# Patient Record
Sex: Female | Born: 1990 | Hispanic: Yes | Marital: Married | Smoking: Never smoker
Health system: Southern US, Community
[De-identification: ages and names within clinical notes are randomized; demographics above are authoritative.]

## PROBLEM LIST (undated history)

## (undated) ENCOUNTER — Inpatient Hospital Stay (HOSPITAL_COMMUNITY): Payer: Self-pay

## (undated) ENCOUNTER — Inpatient Hospital Stay (HOSPITAL_COMMUNITY): Payer: Medicaid Other

## (undated) DIAGNOSIS — K219 Gastro-esophageal reflux disease without esophagitis: Secondary | ICD-10-CM

## (undated) DIAGNOSIS — Z789 Other specified health status: Secondary | ICD-10-CM

## (undated) DIAGNOSIS — M549 Dorsalgia, unspecified: Secondary | ICD-10-CM

## (undated) DIAGNOSIS — O24419 Gestational diabetes mellitus in pregnancy, unspecified control: Secondary | ICD-10-CM

## (undated) HISTORY — DX: Gestational diabetes mellitus in pregnancy, unspecified control: O24.419

## (undated) HISTORY — DX: Dorsalgia, unspecified: M54.9

## (undated) HISTORY — DX: Gastro-esophageal reflux disease without esophagitis: K21.9

## (undated) HISTORY — PX: NO PAST SURGERIES: SHX2092

---

## 2010-08-25 ENCOUNTER — Ambulatory Visit (HOSPITAL_COMMUNITY)
Admission: RE | Admit: 2010-08-25 | Discharge: 2010-08-25 | Payer: Self-pay | Source: Home / Self Care | Admitting: Family Medicine

## 2011-01-16 ENCOUNTER — Other Ambulatory Visit: Payer: Self-pay | Admitting: Family Medicine

## 2011-01-16 DIAGNOSIS — O480 Post-term pregnancy: Secondary | ICD-10-CM

## 2011-01-18 ENCOUNTER — Other Ambulatory Visit: Payer: Self-pay

## 2011-01-20 ENCOUNTER — Ambulatory Visit (HOSPITAL_COMMUNITY): Payer: Self-pay

## 2011-01-20 ENCOUNTER — Ambulatory Visit (HOSPITAL_COMMUNITY)
Admission: RE | Admit: 2011-01-20 | Discharge: 2011-01-20 | Disposition: A | Discharge status: Home / Self Care | Payer: Medicaid Other | Source: Ambulatory Visit | Attending: Family Medicine | Admitting: Family Medicine

## 2011-01-20 DIAGNOSIS — O480 Post-term pregnancy: Secondary | ICD-10-CM | POA: Insufficient documentation

## 2011-01-20 DIAGNOSIS — Z3689 Encounter for other specified antenatal screening: Secondary | ICD-10-CM | POA: Insufficient documentation

## 2011-01-23 ENCOUNTER — Inpatient Hospital Stay (HOSPITAL_COMMUNITY): Payer: Medicaid Other

## 2011-01-23 ENCOUNTER — Inpatient Hospital Stay (HOSPITAL_COMMUNITY)
Admission: RE | Admit: 2011-01-23 | Discharge: 2011-01-23 | Disposition: A | Discharge status: Home / Self Care | Payer: Medicaid Other | Source: Ambulatory Visit | Attending: Family Medicine | Admitting: Family Medicine

## 2011-01-23 DIAGNOSIS — O368390 Maternal care for abnormalities of the fetal heart rate or rhythm, unspecified trimester, not applicable or unspecified: Secondary | ICD-10-CM

## 2011-01-23 LAB — URINALYSIS, ROUTINE W REFLEX MICROSCOPIC
Bilirubin Urine: NEGATIVE
Glucose, UA: NEGATIVE mg/dL
Ketones, ur: NEGATIVE mg/dL
Protein, ur: NEGATIVE mg/dL
pH: 6 (ref 5.0–8.0)

## 2011-01-23 LAB — URINE MICROSCOPIC-ADD ON

## 2011-01-26 ENCOUNTER — Inpatient Hospital Stay (HOSPITAL_COMMUNITY)
Admission: AD | Admit: 2011-01-26 | Discharge: 2011-01-29 | DRG: 774 | Disposition: A | Discharge status: Home / Self Care | Payer: Medicaid Other | Source: Ambulatory Visit | Attending: Obstetrics & Gynecology | Admitting: Obstetrics & Gynecology

## 2011-01-26 DIAGNOSIS — O411090 Infection of amniotic sac and membranes, unspecified, unspecified trimester, not applicable or unspecified: Secondary | ICD-10-CM | POA: Diagnosis present

## 2011-01-26 DIAGNOSIS — O480 Post-term pregnancy: Principal | ICD-10-CM | POA: Diagnosis present

## 2011-01-26 LAB — CBC
HCT: 31 % — ABNORMAL LOW (ref 36.0–46.0)
Hemoglobin: 9.8 g/dL — ABNORMAL LOW (ref 12.0–15.0)
MCV: 82 fL (ref 78.0–100.0)
RDW: 16.8 % — ABNORMAL HIGH (ref 11.5–15.5)
WBC: 9.9 10*3/uL (ref 4.0–10.5)

## 2011-01-27 DIAGNOSIS — O480 Post-term pregnancy: Secondary | ICD-10-CM

## 2011-01-27 DIAGNOSIS — O411090 Infection of amniotic sac and membranes, unspecified, unspecified trimester, not applicable or unspecified: Secondary | ICD-10-CM

## 2011-01-28 LAB — ABO/RH: ABO/RH(D): O POS

## 2015-01-11 ENCOUNTER — Ambulatory Visit: Payer: Medicaid Other | Attending: Internal Medicine | Admitting: Internal Medicine

## 2015-01-11 ENCOUNTER — Encounter: Payer: Self-pay | Admitting: Internal Medicine

## 2015-01-11 DIAGNOSIS — R51 Headache: Secondary | ICD-10-CM

## 2015-01-11 DIAGNOSIS — R519 Headache, unspecified: Secondary | ICD-10-CM

## 2015-01-11 DIAGNOSIS — N3000 Acute cystitis without hematuria: Secondary | ICD-10-CM

## 2015-01-11 DIAGNOSIS — R197 Diarrhea, unspecified: Secondary | ICD-10-CM | POA: Insufficient documentation

## 2015-01-11 DIAGNOSIS — R112 Nausea with vomiting, unspecified: Secondary | ICD-10-CM | POA: Insufficient documentation

## 2015-01-11 DIAGNOSIS — R1084 Generalized abdominal pain: Secondary | ICD-10-CM | POA: Insufficient documentation

## 2015-01-11 LAB — POCT URINALYSIS DIPSTICK
BILIRUBIN UA: NEGATIVE
Blood, UA: NEGATIVE
GLUCOSE UA: NEGATIVE
Ketones, UA: NEGATIVE
NITRITE UA: NEGATIVE
PH UA: 6.5
Protein, UA: NEGATIVE
Spec Grav, UA: 1.02
Urobilinogen, UA: 0.2

## 2015-01-11 LAB — POCT URINE PREGNANCY: PREG TEST UR: NEGATIVE

## 2015-01-11 MED ORDER — CIPROFLOXACIN HCL 500 MG PO TABS: 500.0000 mg | ORAL_TABLET | Freq: Two times a day (BID) | ORAL | Status: DC

## 2015-01-11 MED ORDER — CYCLOBENZAPRINE HCL 5 MG PO TABS: 5.0000 mg | ORAL_TABLET | Freq: Three times a day (TID) | ORAL | Status: DC | PRN

## 2015-01-11 MED ORDER — IBUPROFEN 600 MG PO TABS: 600.0000 mg | ORAL_TABLET | Freq: Three times a day (TID) | ORAL | Status: DC | PRN

## 2015-01-11 NOTE — Patient Instructions (Signed)
Cefalea migrañosa °(Migraine Headache) °Una cefalea migrañosa es un dolor intenso y punzante en uno o ambos lados de la cabeza. La migraña puede durar desde 30 minutos hasta varias horas. °CAUSAS  °No siempre se conoce la causa exacta de la cefalea migrañosa. Sin embargo, pueden aparecer cuando los nervios del cerebro se irritan y liberan ciertas sustancias químicas que causan inflamación. Esto ocasiona dolor. °Existen también ciertos factores que pueden desencadenar las migrañas, como los siguientes: °· Alcohol. °· Fumar. °· Estrés. °· La menstruación °· Quesos añejados. °· Los alimentos o las bebidas que contienen nitratos, glutamato, aspartamo o tiramina. °· Falta de sueño. °· Chocolate. °· Cafeína. °· Hambre. °· Actividad física extenuante. °· Fatiga. °· Medicamentos que se usan para tratar el dolor en el pecho (nitroglicerina), píldoras anticonceptivas, estrógeno y algunos medicamentos para la hipertensión arterial. °SIGNOS Y SÍNTOMAS °· Dolor en uno o ambos lados de la cabeza. °· Dolor pulsante o punzante. °· Dolor intenso que impide realizar las actividades diarias. °· Dolor que se agrava por cualquier actividad física. °· Náuseas, vómitos o ambos. °· Mareos. °· Dolor con la exposición a las luces brillantes, a los ruidos fuertes o la actividad. °· Sensibilidad general a las luces brillantes, a los ruidos fuertes o a los olores. °Antes de sufrir una migraña, puede recibir señales de advertencia (aura). Un aura puede incluir: °· Ver las luces intermitentes. °· Ver puntos brillantes, halos o líneas en zigzag. °· Tener una visión en túnel o visión borrosa. °· Sensación de entumecimiento u hormigueo. °· Dificultad para hablar °· Debilidad muscular. °DIAGNÓSTICO  °La cefalea migrañosa se diagnostica en función de lo siguiente: °· Síntomas. °· Examen físico. °· Una TC (tomografía computada) o resonancia magnética de la cabeza. Estas pruebas de diagnóstico por imagen no pueden diagnosticar las migrañas, pero pueden  ayudar a descartar otras causas de las cefaleas. °TRATAMIENTO °Le prescribirán medicamentos para aliviar el dolor y las náuseas. También podrán administrarse medicamentos para ayudar a prevenir las migrañas recurrentes.  °INSTRUCCIONES PARA EL CUIDADO EN EL HOGAR °· Sólo tome medicamentos de venta libre o recetados para calmar el dolor o el malestar, según las indicaciones de su médico. No se recomienda usar los opiáceos a largo plazo. °· Cuando tenga la migraña, acuéstese en un cuarto oscuro y tranquilo °· Lleve un registro diario para averiguar lo que puede provocar las cefaleas migrañosas. Por ejemplo, escriba: °¨ Lo que usted come y bebe. °¨ Cuánto tiempo duerme. °¨ Algún cambio en su dieta o en los medicamentos. °· Limite el consumo de bebidas alcohólicas. °· Si fuma, deje de hacerlo. °· Duerma entre 7 y 9 horas, o según las recomendaciones del médico. °· Limite el estrés. °· Mantenga las luces tenues si le molestan las luces brillantes y la migraña empeora. °SOLICITE ATENCIÓN MÉDICA DE INMEDIATO SI:  °· La migraña se hace cada vez más intensa. °· Tiene fiebre. °· Presenta rigidez en el cuello. °· Tiene pérdida de visión. °· Presenta debilidad muscular o pérdida del control muscular. °· Comienza a perder el equilibrio o tiene problemas para caminar. °· Sufre mareos o se desmaya. °· Tiene síntomas graves que son diferentes a los primeros síntomas. °ASEGÚRESE DE QUE:  °· Comprende estas instrucciones. °· Controlará su afección. °· Recibirá ayuda de inmediato si no mejora o si empeora. °Document Released: 10/02/2005 Document Revised: 07/23/2013 °[employer]® Patient Information ©2015 [employer]. This information is not intended to replace advice given to you by your health care provider. Make sure you discuss any questions you have with your   health care provider. ° ° °

## 2015-01-11 NOTE — Progress Notes (Signed)
Patient ID: Sheri Perez, female   DOB: 02-Oct-1991, 24 y.o.   MRN: [id number]  [id number]  [id number]  DOB - 02-Oct-1991  CC:  Chief Complaint  Patient presents with  . Establish Care       HPI: Sheri Perez is a 24 y.o. female here today to establish medical care.  She has no past medical history. She reports having abdomina pain with diarrhea and nausea for 3 weeks.  Headaches in frontal/temporal region that cause blurred vision. She has headaches daily that is dull and throbbing.  She states that all symptoms happen at the same time. She has had some episodes of vomiting. Photo/phonophobia. The headaches last all day and are typically relieved by ibuprofen since it is not a strong pain. She reports that she has been more stressed lately. She has been on OCP for over one year. She reports that she has mild burning with urination and frequency for 2 months.Patient's last menstrual period was 12/13/2014.  Patient has No headache, No chest pain, No new weakness tingling or numbness, No Cough - SOB.  No Known Allergies History reviewed. No pertinent past medical history. No current outpatient prescriptions on file prior to visit.   No current facility-administered medications on file prior to visit.   History reviewed. No pertinent family history. History   Social History  . Marital Status: Single    Spouse Name: N/A  . Number of Children: N/A  . Years of Education: N/A   Occupational History  . Not on file.   Social History Main Topics  . Smoking status: Never Smoker   . Smokeless tobacco: Not on file  . Alcohol Use: No  . Drug Use: No  . Sexual Activity: Yes   Other Topics Concern  . Not on file   Social History Narrative  . No narrative on file    Review of Systems: See HPI  Objective:   Filed Vitals:   01/11/15 1524  BP: 132/77  Pulse: 72  Temp: 98.3 F (36.8 C)  Resp: 16    Physical Exam: Constitutional: Patient appears  well-developed and well-nourished. No distress. HENT: Normocephalic, atraumatic, External right and left ear normal. Oropharynx is clear and moist.  Eyes: Conjunctivae and EOM are normal. PERRLA, no scleral icterus. Neck: Normal ROM. Neck supple. No JVD. No tracheal deviation. No thyromegaly. CVS: RRR, S1/S2 +, no murmurs, no gallops, no carotid bruit.  Pulmonary: Effort and breath sounds normal, no stridor, rhonchi, wheezes, rales.  Abdominal: Soft. BS +, no distension, tenderness, rebound or guarding.  Musculoskeletal: Normal range of motion. No edema and no tenderness.  Lymphadenopathy: No lymphadenopathy noted, cervical, inguinal or axillary Neuro: Alert. Normal reflexes, muscle tone coordination. No cranial nerve deficit. Skin: Skin is warm and dry. No rash noted. Not diaphoretic. No erythema. No pallor. Psychiatric: Normal mood and affect. Behavior, judgment, thought content normal.  Lab Results  Component Value Date   WBC 9.9 01/26/2011   HGB 9.8* 01/26/2011   HCT 31.0* 01/26/2011   MCV 82.0 01/26/2011   PLT 178 01/26/2011   No results found for: CREATININE, BUN, NA, K, CL, CO2  No results found for: HGBA1C Lipid Panel  No results found for: CHOL, TRIG, HDL, CHOLHDL, VLDL, LDLCALC     Assessment and plan:   Sheri Berryeresita was seen today for establish care.  Diagnoses and all orders for this visit:  Generalized abdominal pain Orders: -     POCT urinalysis dipstick---+ leukocytes -     POCT urine pregnancy--negative  Believe abdominal pain may be due to UTI in combination  Acute cystitis without hematuria Orders: -     ciprofloxacin (CIPRO) 500 MG tablet; Take 1 tablet (500 mg total) by mouth 2 (two) times daily. Explained ways to prevent UTI's   Frequent headaches Orders: -     cyclobenzaprine (FLEXERIL) 5 MG tablet; Take 1 tablet (5 mg total) by mouth 3 (three) times daily as needed for muscle spasms. For headaches -     ibuprofen (ADVIL,MOTRIN) 600 MG tablet; Take 1  tablet (600 mg total) by mouth every 8 (eight) hours as needed. Explained that headaches may be a result of increased stress. Headaches may cause symptoms of nausea, vomiting, etc.  Return if symptoms worsen or fail to improve.  Due to language barrier, an interpreter was present during the history-taking and subsequent discussion (and for part of the physical exam) with this patient. [name]     Holland Commons, NP-C [facility] [phone] 01/11/2015, 3:41 PM

## 2015-01-11 NOTE — Progress Notes (Signed)
Pt is here today to establish care. Pt reports having pain in her abdomen w/ diarrhea and nausea. Pt also states that she has frequent migraines that causes her to have blurred vision.

## 2015-03-05 ENCOUNTER — Telehealth: Payer: Self-pay | Admitting: Internal Medicine

## 2015-06-16 ENCOUNTER — Encounter (INDEPENDENT_AMBULATORY_CARE_PROVIDER_SITE_OTHER): Payer: Self-pay

## 2015-06-16 ENCOUNTER — Ambulatory Visit: Payer: Medicaid Other | Attending: Internal Medicine | Admitting: Internal Medicine

## 2015-06-16 ENCOUNTER — Encounter: Payer: Self-pay | Admitting: Internal Medicine

## 2015-06-16 VITALS — BP 113/70 | HR 65 | Temp 98.0°F | Resp 16 | Ht 61.0 in | Wt 151.6 lb

## 2015-06-16 DIAGNOSIS — N644 Mastodynia: Secondary | ICD-10-CM | POA: Diagnosis not present

## 2015-06-16 DIAGNOSIS — M791 Myalgia: Secondary | ICD-10-CM | POA: Diagnosis not present

## 2015-06-16 DIAGNOSIS — M7918 Myalgia, other site: Secondary | ICD-10-CM

## 2015-06-16 MED ORDER — MELOXICAM 7.5 MG PO TABS: 7.5000 mg | ORAL_TABLET | Freq: Every day | ORAL | Status: DC

## 2015-06-16 NOTE — Progress Notes (Signed)
Patient ID: Sheri Perez, female   DOB: [birth year], 24 y.o.   MRN: [id number]  CC: left side pain   HPI: Sheri Perez is a 24 y.o. female here today for a follow up visit.  Patient has no past medical history. Patient complains of having pain under her left arm that started about two weeks ago. Patient states at first it started as a burning pain but now under her arm is very sensitive to touch. She states that if nothing is touching the area then it does not hurt. She denies any injury or possible pulled muscle. She denies caffeine intake. LMP was 3 weeks ago.  Patient has No headache, No chest pain, No abdominal pain - No Nausea, No new weakness tingling or numbness, No Cough - SOB.  No Known Allergies History reviewed. No pertinent past medical history. No current outpatient prescriptions on file prior to visit.   No current facility-administered medications on file prior to visit.   History reviewed. No pertinent family history. Social History   Social History  . Marital Status: Single    Spouse Name: N/A  . Number of Children: N/A  . Years of Education: N/A   Occupational History  . Not on file.   Social History Main Topics  . Smoking status: Never Smoker   . Smokeless tobacco: Not on file  . Alcohol Use: No  . Drug Use: No  . Sexual Activity: Yes   Other Topics Concern  . Not on file   Social History Narrative    Review of Systems: Other than what is stated in HPI, all other systems are negative.   Objective:   Filed Vitals:   06/16/15 1211  BP: 113/70  Pulse: 65  Temp: 98 F (36.7 C)  Resp: 16    Physical Exam  Cardiovascular: Normal rate, regular rhythm and normal heart sounds.   Pulmonary/Chest: Effort normal and breath sounds normal. Right breast exhibits no mass. Left breast exhibits no mass. There is no breast swelling.    Genitourinary: No breast tenderness.  Musculoskeletal: Normal range of motion. She exhibits tenderness.   Skin: Skin is warm and dry.  '  Lab Results  Component Value Date   WBC 9.9 01/26/2011   HGB 9.8* 01/26/2011   HCT 31.0* 01/26/2011   MCV 82.0 01/26/2011   PLT 178 01/26/2011   No results found for: CREATININE, BUN, NA, K, CL, CO2  No results found for: HGBA1C Lipid Panel  No results found for: CHOL, TRIG, HDL, CHOLHDL, VLDL, LDLCALC     Assessment and plan:   Sheri Perez was seen today for breast pain.  Diagnoses and all orders for this visit:  Musculoskeletal pain -     meloxicam (MOBIC) 7.5 MG tablet; Take 1 tablet (7.5 mg total) by mouth daily. For 10-14 days Pain appears to be musculoskeletal due to reproducible pain. She will try Mobic daily for the next 10-14 days and call me back if pain does not resolve.   Return if symptoms worsen or fail to improve.       Ambrose Finland, NP-C [facility] [phone] 06/16/2015, 12:38 PM

## 2015-06-16 NOTE — Progress Notes (Signed)
Patient complains of having pain under her left arm that started about two weeks ago Patient states at first it started as a burning pain but now under her arm is very sensitive to touch Patient has not had a mammogram Denies any injury Interpreter line used Pablo-ID#[id number]

## 2015-07-09 ENCOUNTER — Emergency Department (HOSPITAL_COMMUNITY)
Admission: EM | Admit: 2015-07-09 | Discharge: 2015-07-09 | Disposition: A | Discharge status: Home / Self Care | Payer: Medicaid Other | Source: Home / Self Care | Attending: Emergency Medicine | Admitting: Emergency Medicine

## 2015-07-09 ENCOUNTER — Encounter (HOSPITAL_COMMUNITY): Payer: Self-pay | Admitting: Emergency Medicine

## 2015-07-09 DIAGNOSIS — M5432 Sciatica, left side: Secondary | ICD-10-CM | POA: Diagnosis not present

## 2015-07-09 DIAGNOSIS — G5702 Lesion of sciatic nerve, left lower limb: Secondary | ICD-10-CM | POA: Diagnosis not present

## 2015-07-09 MED ORDER — TRAMADOL HCL 50 MG PO TABS: 50.0000 mg | ORAL_TABLET | Freq: Four times a day (QID) | ORAL | Status: DC | PRN

## 2015-07-09 MED ORDER — DEXAMETHASONE SODIUM PHOSPHATE 10 MG/ML IJ SOLN
10.0000 mg | Freq: Once | INTRAMUSCULAR | Status: AC
  Administered 2015-07-09: 10 mg via INTRAMUSCULAR

## 2015-07-09 MED ORDER — PREDNISONE 20 MG PO TABS: ORAL_TABLET | ORAL | Status: DC

## 2015-07-09 MED ORDER — DEXAMETHASONE SODIUM PHOSPHATE 10 MG/ML IJ SOLN
INTRAMUSCULAR | Status: AC
  Filled 2015-07-09: qty 1

## 2015-07-09 NOTE — ED Notes (Signed)
Left buttocks pain for 25 days, but pain worsened today.  No known injury.  Denies fall

## 2015-07-09 NOTE — Discharge Instructions (Signed)
Sndrome del piriforme con rehabilitacin (Piriformis Syndrome with Rehab) El sndrome piriforme es una enfermedad que afecta el sistema nervioso en la zona de la cadera, y est caracterizado por dolor y Neomia Dear posible prdida de sensibilidad en la parte de atrs (posterior) del muslo que puede extenderse hacia toda la pierna. Los sntomas son debidos a Art gallery manager presin del nervio citico por el msculo piriforme, que se encuentra en la parte posterior de la cadera y es el responsable de rotar la misma hacia afuera. El nervio citico y sus ramificaciones se conectan a gran parte de la pierna. Normalmente, el nervio citico va por el msculo piriforme y otros msculos. Sin embargo, en ciertos individuos el nervio va a travs del [place], lo que ocasiona un aumento en la presin del nervio y Futures trader como resultado los sntomas del sndrome piriforme. SNTOMAS  Dolor, hormigueo, adormecimiento, o ardor en la parte posterior del muslo que puede extenderse a toda la pierna.  Algunas veces, sensibilidad en las nalgas.  Prdida de la funcin de la pierna.  Dolor que empeora al [employer] el msculo piriforme (correr, saltar o Games developer).  Dolor al Personal assistant sentado durante mucho tiempo.  Dolor que disminuye al apoyar la espalda sobre un lugar plano. CAUSAS  El sndrome del piriforme es el resultado de un aumento en la presin sobre el nervio citico. Con frecuencia se trata de una lesin por uso excesivo.  Estrs sobre el nervio que proviene de un sbito aumento en la intensidad, frecuencia o duracin del entrenamiento.  Compensacin para otras lesiones en las extremidades. LOS RIESGOS AUMENTAN CON  Deportes que implican utilizar el msculo piriforme (correr, saltar o Games developer).  Se nace con eso (congnito), un defecto en el que el nervio citico pasa a travs del muslo. PREVENCIN  Precalentamiento adecuado y elongacin antes de la [place].  Descanso y recuperacin  entre actividades.  Mantener la forma fsica:  Earma [place], flexibilidad y resistencia muscular.  Capacidad cardiovascular. PRONSTICO Si se trata adecuadamente, los sntomas se resuelven en 2 a 6 semanas. POSIBLES COMPLICACIONES  Dolor y adormecimiento persistente y posiblemente permanente en la extremidad inferior.  Debilidad en la extremidad que puede progresar a una incapacidad para competir. TRATAMIENTO El tratamiento inicial incluye interrumpir las actividades que agravan los sntomas. El tratamiento inicial incluye el uso de medicamentos y la aplicacin de hielo para reducir Chief Technology Officer y la inflamacin. Los ejercicios de elongacin y fortalecimiento pueden ayudar a reducir Chief Technology Officer con la [place]. Los ejercicios pueden Management consultant o con un terapeuta. Pueden requerir la derivacin a un fisioterapeuta para Magazine features editor evaluacin y Games developer un tratamiento, como ultrasonido. Se podrn administrar medicamentos con corticoides para disminuir la inflamacin que se causa por la presin en el nervio citico. Si no se obtiene xito con Artist, ser necesario someterse a [place].  MEDICAMENTOS  Si necesita analgsicos, se recomiendan los antiinflamatorios no esteroides, como aspirina e ibuprofeno y otros calmantes menores, como acetaminofeno  No tome medicamentos para Chief Technology Officer dentro de los [address] previos a la [place].  Los analgsicos prescriptos se indicarn si el mdico lo considera necesario. Utilcelos como se le indique y slo cuando lo necesite.  En algunos casos se indica una inyeccin de corticosteroides. Estas inyecciones deben reservarse para los [place] graves, porque slo se pueden administrar una determinada cantidad de veces. CALOR Y FRO:  El tratamiento con fro [employer] y reduce la inflamacin. El fro debe aplicarse durante 10 a 15  minutos cada 2  3 horas para reducir la inflamacin y Chief Technology Officer e inmediatamente despus de cualquier  actividad que agrava los sntomas. Utilice bolsas de hielo o masajee la zona con un trozo de hielo (masaje de hielo).  El calor puede usarse antes de Therapist, music y de las actividades de fortalecimiento indicadas por el profesional, le fisioterapeuta o Orthoptist. Utilice una bolsa trmica o sumerja la lesin en agua caliente. SOLICITE ATENCIN MDICA DE INMEDIATO SI:  El tratamiento no lo beneficia, o el trastorno empeora.  Los medicamentos producen efectos secundarios. EJERCICIOS EJERCICIOS DE AMPLITUD DE MOVIMIENTOS Y ELONGACIN - Sndrome Piriforme Estos ejercicios le ayudarn en la recuperacin de la lesin. Los sntomas podrn aliviarse con o sin asistencia adicional de su mdico, fisioterapeuta o Herbalist. Al completar estos ejercicios, recuerde:   Restaurar la flexibilidad del tejido ayuda a que las articulaciones recuperen el movimiento normal. Esto permite que el movimiento y la actividad sea ms saludables y menos dolorosos.  Para que sea efectiva, cada elongacin debe realizarse durante al menos 30 segundos.  La elongacin nunca debe ser dolorosa. Deber sentir slo un alargamiento o distensin suave del tejido que estira. ELONGACIN - Rotadores de la cadera  Recustese sobre su espalda en una superficie firme. Tome su rodilla derecha / izquierdo con la mano derecha / izquierdo y el tobillo con la mano opuesta.  Con las caderas y los hombros bien apoyados, tire suavemente de la rodilla Sales executive / izquierdo y Photographer parte baja de la pierna hacia el hombro opuesto hasta que sienta un estiramiento en las nalgas.  Mantenga esta posicin durante __________ segundos. Reptalo __________ veces. Realice este estiramiento __________ Anthoney Harada por da. ELONGACIN - Banda iliotibial  En el suelo o cama, recustese de lado de modo que la pierna derecha / izquierdo [place]. Incline su rodilla y tmese del tobillo.  Lleve lentamente la rodilla hacia atrs, hasta que el muslo est en  lnea con el tronco. Mantenga el taln en las nalgas y arquee suavemente hacia atrs la cabeza, con hombros y caderas alineados.  Baje suavemente la pierna hasta que la rodilla est cerca del suelo o cama hasta sentir un ligero estiramiento en la zona externa del muslo derecha / izquierdo. Si no siente un estiramiento y la rodilla no queda cerca del suelo, coloque el taln del otro pie por arriba de la rodilla y empuje la rodilla y el muslo un poco ms hacia abajo.  Mantenga esta posicin durante __________ segundos. Reptalo __________ veces. Realice este ejercicio __________ veces por da. EJERCICIOS DE FORTALECIMIENTO - Sndrome del piriforme Estos son algunos de los ejercicios que puede realizar hasta que vuelva a ver al profesional que lo asiste o hasta que los sntomas hayan desaparecido. Recuerde:   Los msculos fuertes con buena resistencia toleran mejor el estrs  Realice los ejercicios como inicialmente se los indic el profesional. Avance lentamente con cada ejercicio aumentando gradualmente el nmero de repeticiones y el peso que utiliz segn las indicaciones. FUERZA - Aductores de la cadera, elevacin de la pierna extendida Vigile su posicin durante todo el ejercicio de modo que est seguro que trabajan los msculos correctos. Si lo hace de manera descuidada, no estar fortaleciendo los msculos correctos.  Descanse sobre un lado de modo que su cabeza, hombros, rodilla y cadera queden alineados. Puede doblar la rodilla que est por debajo para mantener el equilibrio. Su pierna derecha / izquierdo debe [place].  Ruede con la cadera [employer]ogg, de modo que [place]  lineadas y su rodilla derecha / izquierdo quede hacia adelante.  Levante 10 a 15 cm la pierna que qued [place], haciendo fuerza con el taln. Asegrese que el pie no se vaya hacia adelante ni que la rodilla ruede [place].  Mantenga esta posicicin durante __________ segundos. Debe sentir los msculos  de la cadera se elevan (podr no notarlo hasta que comience a sentir cansancio en la pierna).  Baje lentamente la pierna hasta la posicin inicial. Permita que los msculos se relajen completamente antes de repetir. Reptalo __________ veces. Realice este ejercicio __________ veces por da.  FUERZA - Abductores de cadera, cuadrpedo  [employer] Corporation y las rodillas en una superficie acolchada. Las manos deben estar directamente sobre los hombros y las rodillas debajo de las caderas.  Mantenga la rodilla derecha / izquierdo doblada, levante y lleve la pierna hacia un lado. Mantenga el nivel de las piernas en lnea con los hombros.  Colquese sobre las manos y las rodillas como se indica en la figura.  Mantenga esta posicin durante __________ segundos.  Mantenga el tronco firme y las caderas niveladas, y baje lentamente la pierna hasta la posicin inicial. Reptalo __________ veces. Realice este ejercicio __________ veces por da.  FUERZA - Abductores de cadera, de pie  Asegure un extremo de una banda de goma para ejercicios a un objeto fijo (mesa, columna) y haga un lazo en el otro extremo.  Coloque el lazo alrededor de su tobillo derecha / izquierdo. Con el tobillo con la banda en el extremo opuesto al asegurado, aljese hasta que sienta que la banda se tensa.  Sostngase de una silla para mantener el equilibrio.  Con la espalda erguida, los hombros [employer] caderas y los dedos de los pies hacia adelante, levante la pierna derecha / izquierdo hacia un lado. Asegrese de Printmaker fuerza con los msculos de las caderas. No "lance" la pierna ni incline el cuerpo para levantarla.  De manera controlada, vuelva lentamente a la posicin inicial. Repita el ejercicio __________ veces. Realice este ejercicio __________ veces por da.  Document Released: 07/19/2006 Document Revised: 04/02/2012 [facility] Patient Information 2015 [place], [place]. This information is not intended to  replace advice given to you by your health care provider. Make sure you discuss any questions you have with your health care provider.  Citica  (Sciatica)  La citica es Chief Technology Officer, debilidad, entumecimiento u hormigueo a lo largo del nervio citico. El nervio comienza en la zona inferior de la espalda y desciende por la parte posterior de cada pierna. El nervio controla los msculos de la parte inferior de la pierna y de la zona posterior de la rodilla, y transmite la sensibilidad a la parte posterior del muslo, la pierna y la planta del pie. La citica es un sntoma de otras afecciones mdicas. Por ejemplo, un dao a los nervios o algunas enfermedades como un disco herniado o un espoln seo en la [place] vertebral, podran daarle o presionar en el nervio citico. Esto causa dolor, debilidad y otras sensaciones normalmente asociadas con la citica. Generalmente la citica afecta slo un lado del cuerpo. CAUSAS   Disco herniado o desplazado.  Enfermedad degenerativa del disco.  Un sndrome doloroso que compromete un msculo angosto de los glteos (sndrome piriforme).  Lesin o fractura plvica.  Embarazo.  Tumor (casos raros). SNTOMAS  Los sntomas pueden variar de leves a muy graves. Por lo general, los sntomas descienden desde la zona lumbar a las nalgas y la parte posterior de  la pierna. Ellos son:   Hormigueo leve o dolor sordo en la parte inferior de la espalda, la pierna o la cadera.  Adormecimiento en la parte posterior de la pantorrilla o la planta del pie.  Sensacin de [employer] zona lumbar, la pierna o la cadera.  Dolor agudo en la zona inferior de la espalda, la pierna o la cadera.  Debilidad en las piernas.  Dolor de espalda intenso que [employer] movimientos. Los sntomas pueden empeorar al toser, Engineering geologist, rer o estar sentado o parado durante [employer]. Adems, el sobrepeso puede empeorar los sntomas.  DIAGNSTICO  Su mdico le har un examen fsico  para buscar los sntomas comunes de la citica. Le pedir que haga algunos movimientos o actividades que activaran el dolor del nervio citico. Para encontrar las causas de la citica podr indicarle otros estudios. Estos pueden ser:   Anlisis de [place].  Radiografas.  Pruebas de diagnstico por imgenes, como resonancia magntica o tomografa computada. TRATAMIENTO  El tratamiento se dirige a las causas de la citica. A veces, el tratamiento no es necesario, y Chief Technology Officer y Environmental health practitioner desaparecen por s mismos. Si necesita tratamiento, su mdico puede sugerir:   Medicamentos de venta libre para Engineer, materials.  Medicamentos recetados, como antiinflamatorios, relajantes musculares o narcticos.  Aplicacin de calor o hielo en la zona del dolor.  Inyecciones de corticoides para disminuir el dolor, la irritacin y la inflamacin alrededor del nervio.  Reduccin de la [employer] perodos de Susan Moore.  Ejercicios y estiramiento del abdomen para fortalecer y Scientist, clinical (histocompatibility and immunogenetics) la flexibilidad de la columna vertebral. Su mdico puede sugerirle perder peso si el peso extra empeora el dolor de espalda.  Fisioterapia.  La ciruga para eliminar lo que presiona o pincha el nervio, como un espoln seo o parte de una hernia de disco. INSTRUCCIONES PARA EL CUIDADO EN EL HOGAR   Slo tome medicamentos de venta libre o recetados para Primary school teacher o Environmental health practitioner, segn las indicaciones de su mdico.  Aplique hielo sobre el rea dolorida durante 20 minutos 3-4 veces por da durante los primeras 48-72 horas. Luego intente aplicar calor de la misma manera.  Haga ejercicios, elongue o realice sus actividades habituales, si no le causan ms dolor.  Cumpla con todas las sesiones de fisioterapia, segn le indique su mdico.  Cumpla con todas las visitas de control, segn le indique su mdico.  No use tacones altos o zapatos que no tengan buen apoyo.  Verifique que el colchn no sea muy blando. Un  colchn firme Engineer, materials y las [place]. SOLICITE ATENCIN MDICA DE INMEDIATO SI:   Pierde el control de la vejiga o del intestino (incontinencia).  Aumenta la debilidad en la zona inferior de la espalda, la pelvis, las nalgas o las piernas.  Siente irritacin o inflamacin en la espalda.  Tiene sensacin de ardor al [employer].  El dolor empeora cuando se acuesta o lo despierta por la noche.  El dolor es peor del que experiment en el pasado.  Dura ms de 4 semanas.  Pierde peso sin motivo de [place] sbita. ASEGRESE DE QUE:   Comprende estas instrucciones.  Controlar su enfermedad.  Solicitar ayuda de inmediato si no mejora o si empeora. Document Released: 10/02/2005 Document Revised: 04/02/2012 [facility] Patient Information 2015 [place], [place]. This information is not intended to replace advice given to you by your health care provider. Make sure you discuss any questions you have with your health care provider.

## 2015-07-09 NOTE — ED Provider Notes (Signed)
CSN: [id number]     Arrival date & time 07/09/15  1942 History   First MD Initiated Contact with Patient 07/09/15 2052     Chief Complaint  Patient presents with  . Pain   (Consider location/radiation/quality/duration/timing/severity/associated sxs/prior Treatment) HPI Comments: 24 year old  Hispanic female presents with 25 day history of left hip pain radiating to the left posterior thigh pain. Denies any known injury. It is worse with sitting and standing. It is worse when flexing the hip. Denies pain to the hip. Denies back pain. She describes it as a sharp lancinating type pain specifically with certain movements. She points as the pain originates in the upper left buttock down the mid left posterior buttock and the mid posterior thigh.    History reviewed. No pertinent past medical history. History reviewed. No pertinent past surgical history. No family history on file. Social History  Substance Use Topics  . Smoking status: Never Smoker   . Smokeless tobacco: None  . Alcohol Use: No   OB History    No data available     Review of Systems  Constitutional: Positive for activity change. Negative for fever.  Gastrointestinal: Negative.   Genitourinary: Negative.   Musculoskeletal: Negative for back pain, joint swelling and neck pain.  Skin: Negative.   All other systems reviewed and are negative.   Allergies  Review of patient's allergies indicates no known allergies.  Home Medications   Prior to Admission medications   Medication Sig Start Date End Date Taking? Authorizing Provider  predniSONE (DELTASONE) 20 MG tablet 3 Tabs PO Days 1-3, then 2 tabs PO Days 4-6, then 1 tab PO Day 7-9, then Half Tab PO Day 10-12 07/09/15   Hayden Rasmussen, NP  traMADol (ULTRAM) 50 MG tablet Take 1 tablet (50 mg total) by mouth every 6 (six) hours as needed. 07/09/15   Hayden Rasmussen, NP   Meds Ordered and Administered this Visit   Medications  dexamethasone (DECADRON) injection 10 mg (10 mg  Intramuscular Given 07/09/15 2124)    BP 103/70 mmHg  Pulse 62  Temp(Src) 98 F (36.7 C) (Oral)  Resp 12  SpO2 100%  LMP 06/25/2015 No data found.   Physical Exam  Constitutional: She is oriented to person, place, and time. She appears well-developed and well-nourished. No distress.  HENT:  Head: Normocephalic and atraumatic.  Eyes: EOM are normal.  Neck: Normal range of motion. Neck supple.  Pulmonary/Chest: Effort normal. No respiratory distress.  Musculoskeletal:  No tenderness to the lower back spine or musculature. Deep palpation to the left buttock and left posterior thigh reveals no tenderness. Straight leg raises produces severe pain to the buttock and posterior thigh. When the patient is standing and having her flex forward this also initiates the pain.  Neurological: She is alert and oriented to person, place, and time. No cranial nerve deficit.  Skin: Skin is warm and dry.  Psychiatric: She has a normal mood and affect.  Nursing note and vitals reviewed.   ED Course  Procedures (including critical care time)  Labs Review Labs Reviewed - No data to display  Imaging Review No results found.   Visual Acuity Review  Right Eye Distance:   Left Eye Distance:   Bilateral Distance:    Right Eye Near:   Left Eye Near:    Bilateral Near:         MDM   1. Sciatica neuralgia, left   2. Pyriformis syndrome, left    Decadron 10 mg IM Prednisone  taper dose Tramadol 50 mg #15 See PCP next week   Hayden Rasmussen, NP 07/09/15 2128

## 2015-09-01 ENCOUNTER — Emergency Department (HOSPITAL_COMMUNITY)
Admission: EM | Admit: 2015-09-01 | Discharge: 2015-09-01 | Disposition: A | Discharge status: Home / Self Care | Payer: Medicaid Other | Source: Home / Self Care | Attending: Family Medicine | Admitting: Family Medicine

## 2015-09-01 ENCOUNTER — Encounter (HOSPITAL_COMMUNITY): Payer: Self-pay | Admitting: *Deleted

## 2015-09-01 ENCOUNTER — Inpatient Hospital Stay (HOSPITAL_COMMUNITY)
Admission: AD | Admit: 2015-09-01 | Discharge: 2015-09-01 | Disposition: A | Discharge status: Home / Self Care | Payer: Medicaid Other | Source: Ambulatory Visit | Attending: Obstetrics & Gynecology | Admitting: Obstetrics & Gynecology

## 2015-09-01 DIAGNOSIS — Z30432 Encounter for removal of intrauterine contraceptive device: Secondary | ICD-10-CM | POA: Insufficient documentation

## 2015-09-01 DIAGNOSIS — N938 Other specified abnormal uterine and vaginal bleeding: Secondary | ICD-10-CM | POA: Diagnosis not present

## 2015-09-01 DIAGNOSIS — N939 Abnormal uterine and vaginal bleeding, unspecified: Secondary | ICD-10-CM | POA: Diagnosis not present

## 2015-09-01 LAB — POCT URINALYSIS DIP (DEVICE)
Bilirubin Urine: NEGATIVE
Glucose, UA: NEGATIVE mg/dL
KETONES UR: NEGATIVE mg/dL
NITRITE: NEGATIVE
PH: 7 (ref 5.0–8.0)
PROTEIN: NEGATIVE mg/dL
Specific Gravity, Urine: 1.025 (ref 1.005–1.030)
Urobilinogen, UA: 1 mg/dL (ref 0.0–1.0)

## 2015-09-01 LAB — POCT I-STAT, CHEM 8
BUN: 15 mg/dL (ref 6–20)
CREATININE: 0.5 mg/dL (ref 0.44–1.00)
Calcium, Ion: 1.22 mmol/L (ref 1.12–1.23)
Chloride: 104 mmol/L (ref 101–111)
GLUCOSE: 94 mg/dL (ref 65–99)
HCT: 43 % (ref 36.0–46.0)
HEMOGLOBIN: 14.6 g/dL (ref 12.0–15.0)
Potassium: 3.6 mmol/L (ref 3.5–5.1)
Sodium: 141 mmol/L (ref 135–145)
TCO2: 24 mmol/L (ref 0–100)

## 2015-09-01 LAB — POCT PREGNANCY, URINE: PREG TEST UR: NEGATIVE

## 2015-09-01 MED ORDER — MEGESTROL ACETATE 20 MG PO TABS: 20.0000 mg | ORAL_TABLET | Freq: Two times a day (BID) | ORAL | Status: DC

## 2015-09-01 NOTE — ED Notes (Addendum)
Pt  Has  An  iud      And  Has  intermittant  Bleeding         For  3  Months    Pt  Reports        Bleeding is  Heavier  Today           She     Is  Using  3  Pads  Per  Day      And  Has  To  Change  It  Every  Hour  Today        She  Is  Awake  And  Alert   And    Alert                  [employer]pret[employer]

## 2015-09-01 NOTE — MAU Provider Note (Signed)
History     CSN: [id number]  Arrival date and time: 09/01/15 1857   First Provider Initiated Contact with Patient 09/01/15 1952      No chief complaint on file.  HPI [name] is a 24 y.o. female who presents to MAU today from Urgent Care for evaluation of heavy vaginal bleeding. The patient states that [facility] placed IUD 3 months ago. She has been bleeding daily since then. She feels dizzy and has headache today. She denies abdominal pain. She states that today the bleeding became much worse and she soaked through her clothes this morning.   OB History    No data available      No past medical history on file.  No past surgical history on file.  No family history on file.  Social History  Substance Use Topics  . Smoking status: Never Smoker   . Smokeless tobacco: Not on file  . Alcohol Use: No    Allergies: No Known Allergies  Prescriptions prior to admission  Medication Sig Dispense Refill Last Dose  . predniSONE (DELTASONE) 20 MG tablet 3 Tabs PO Days 1-3, then 2 tabs PO Days 4-6, then 1 tab PO Day 7-9, then Half Tab PO Day 10-12 20 tablet 0   . traMADol (ULTRAM) 50 MG tablet Take 1 tablet (50 mg total) by mouth every 6 (six) hours as needed. 15 tablet 0     Review of Systems  Constitutional: Negative for fever and malaise/fatigue.  Gastrointestinal: Negative for abdominal pain.  Genitourinary:       + vaginal bleeding  Neurological: Positive for dizziness. Negative for loss of consciousness.   Physical Exam   Blood pressure 123/72, pulse 64, temperature 98.3 F (36.8 C), temperature source Oral, resp. rate 16, SpO2 100 %.  Physical Exam  Nursing note and vitals reviewed. Constitutional: She is oriented to person, place, and time. She appears well-developed and well-nourished. No distress.  HENT:  Head: Normocephalic and atraumatic.  Cardiovascular: Normal rate.   Respiratory: Effort normal.  GI: Soft. She exhibits no  distension and no mass. There is no tenderness. There is no rebound and no guarding.  Genitourinary: There is bleeding (moderate blood in vaginal vault) in the vagina. No vaginal discharge found.  Neurological: She is alert and oriented to person, place, and time.  Skin: Skin is warm and dry. No erythema.  Psychiatric: She has a normal mood and affect.   Results for orders placed or performed during the hospital encounter of 09/01/15 (from the past 24 hour(s))  POCT urinalysis dip (device)     Status: Abnormal   Collection Time: 09/01/15  4:54 PM  Result Value Ref Range   Glucose, UA NEGATIVE NEGATIVE mg/dL   Bilirubin Urine NEGATIVE NEGATIVE   Ketones, ur NEGATIVE NEGATIVE mg/dL   Specific Gravity, Urine 1.025 1.005 - 1.030   Hgb urine dipstick MODERATE (A) NEGATIVE   pH 7.0 5.0 - 8.0   Protein, ur NEGATIVE NEGATIVE mg/dL   Urobilinogen, UA 1.0 0.0 - 1.0 mg/dL   Nitrite NEGATIVE NEGATIVE   Leukocytes, UA TRACE (A) NEGATIVE  Pregnancy, urine POC     Status: None   Collection Time: 09/01/15  4:59 PM  Result Value Ref Range   Preg Test, Ur NEGATIVE NEGATIVE  I-STAT, chem 8     Status: None   Collection Time: 09/01/15  5:15 PM  Result Value Ref Range   Sodium 141 135 - 145 mmol/L   Potassium 3.6 3.5 - 5.1  mmol/L   Chloride 104 101 - 111 mmol/L   BUN 15 6 - 20 mg/dL   Creatinine, Ser 4.78 0.44 - 1.00 mg/dL   Glucose, Bld 94 65 - 99 mg/dL   Calcium, Ion 2.95 6.21 - 1.23 mmol/L   TCO2 24 0 - 100 mmol/L   Hemoglobin 14.6 12.0 - 15.0 g/dL   HCT 30.8 65.7 - 84.6 %    MAU Course  Procedures GYNECOLOGY CLINIC PROCEDURE NOTE  IUD Removal  Patient was in the dorsal lithotomy position, normal external genitalia was noted.  A speculum was placed in the patient's vagina, normal discharge was noted, no lesions. The multiparous cervix was visualized, no lesions, no abnormal discharge.  The strings of the IUD were grasped and pulled using ring forceps. The IUD was removed in its  entirety.Patient tolerated the procedure well.   Patient will use condoms for contraception until follow-up appointment.   MDM CBC stable IUD removed today due to heavy bleeding  Assessment and Plan  A: Abnormal uterine bleeding with Paragard IUD IUD removal  P: Discharge home Rx for Megace given to patient Bleeding precautions discussed Patient referred to [facility] for birth control counseling. Safe sex practices advised until that appointment.  Patient may return to MAU as needed or if her condition were to change or worsen   Marny Lowenstein, PA-C  09/01/2015, 8:12 PM

## 2015-09-01 NOTE — Discharge Instructions (Signed)
Elección del método anticonceptivo  (Contraception Choices)  La anticoncepción (control de la natalidad) es el uso de cualquier método o dispositivo para evitar el embarazo. A continuación se indican algunos de esos métodos.  ANTICONCEPTIVOS HORMONALES  · Un pequeño tubo colocado bajo la piel de la parte superior del brazo (implante). El tubo puede permanecer en el lugar durante 3 años. El implante debe quitarse después de 3 años.  · Inyecciones que se aplican cada 3 meses.  · Píldoras que deben tomarse todos los días.  · Parches que se cambian una vez por semana.  · Un anillo que se coloca en la vagina (anillo vaginal). El anillo se deja en su lugar durante 3 semanas y se retira durante 1 semana. Luego se coloca un nuevo anillo en la vagina.  · Píldoras para el control de la natalidad después de tener sexo (relaciones sexuales) sin protección.  ANTICONCEPTIVOS DE BARRERA   · Una cubierta delgada que se usa sobe el pene (condón masculino) que se coloca durante las relaciones sexuales.  · Una cubierta blanda y suelta que se coloca en la vagina (condón femenino) antes de las relaciones sexuales.  · Un dispositivo de goma que se aplica sobre el cuello del útero (diafragma). Este dispositivo debe ser hecho para usted. Se coloca en la vagina antes de tener relaciones sexuales. Debe dejarlo colocado en la vagina durante 6 a 8 horas después de las relaciones sexuales.  · Un capuchón pequeño y suave que se fija sobre el cuello del útero (capuchón cervical). Este capuchón debe ser hecho para usted. Debe dejarlo colocado en la vagina durante 48 horas después de las relaciones sexuales.  · Una esponja que se coloca en la vagina antes de tener relaciones sexuales.  · Una sustancia química que destruye o impide que los espermatozoides ingresen al cuello y al útero (espermicida). La sustancia química puede ser en crema, gel, espuma o píldoras.  DISPOSITIVO DE CONTROL INTRAUTERINO (DIU)   · El DIU es un pequeño dispositivo  plástico en forma de T. Se coloca dentro del útero. Hay dos tipos de DIU:    DIU de cobre. El dispositivo está recubierto en alambre de cobre. El cobre produce un líquido que destruye los espermatozoides. Puede permanecer colocado durante 10 años.    DIU hormonal. La hormona impide que ocurra el embarazo. Puede permanecer colocado durante 5 años.  MÉTODOS PERMANENTES  · La mujer puede hacerse sellar, ligar u obstruir las trompas de Falopio durante una cirugía. Esto impide que el óvulo llegue hasta el útero.  · El médico coloca un alambre diminuto o lo inserta en cada una de las trompas de Falopio. Esto produce un tejido cicatrizal que obstruye las trompas de Falopio.  · En el hombre pueden ligarse los conductos por los que pasan los espermatozoides (vasectomía).  CONTROL DE LA NATALIDAD POR PLANIFICACIÓN FAMILIAR NATURAL   · La planificación familiar natural significa no tener relaciones sexuales o usar un método anticonceptivo de barrera en los períodos fértiles de la mujer.  · Use a un almanaque para llevar un registro de la extensión de cada período y para conocer los días en los que puede quedar embarazada.  · Evite tener relaciones sexuales durante la ovulación.  · Use un termómetro para medir la temperatura corporal. También reconozca los síntomas de la ovulación.  · El momento de tener relaciones sexuales debe ser después de que la mujer haya ovulado.  Use condones para protegerse de las enfermedades de transmisión sexual (ETS). Hágalo   independientemente del tipo de anticonceptivo que use. Hable con su médico acerca de cuál es el mejor método anticonceptivo para usted.     Esta información no tiene como fin reemplazar el consejo del médico. Asegúrese de hacerle al médico cualquier pregunta que tenga.     Document Released: 01/17/2011 Document Revised: 10/07/2013  Elsevier Interactive Patient Education ©2016 [employer] Inc[employer]

## 2015-09-01 NOTE — ED Provider Notes (Signed)
CSN: [id number]     Arrival date & time 09/01/15  1524 History   First MD Initiated Contact with Patient 09/01/15 1642     Chief Complaint  Patient presents with  . Vaginal Bleeding   (Consider location/radiation/quality/duration/timing/severity/associated sxs/prior Treatment) Patient is a 24 y.o. female presenting with vaginal bleeding. The history is provided by the patient. The history is limited by a language barrier. A language interpreter was used.  Vaginal Bleeding Quality:  Spotting Severity:  Moderate Onset quality:  Gradual Progression:  Worsening Chronicity:  New (had IUD placed 3mos ago with daily bleeding since which got worse today.[name].) Number of pads used:  3 per day, but today every hr. Possible pregnancy: no   Context: spontaneously   Relieved by:  None tried Worsened by:  Nothing tried Ineffective treatments:  None tried Associated symptoms: dizziness and fatigue   Associated symptoms: no abdominal pain, no back pain and no dysuria     History reviewed. No pertinent past medical history. History reviewed. No pertinent past surgical history. History reviewed. No pertinent family history. Social History  Substance Use Topics  . Smoking status: Never Smoker   . Smokeless tobacco: None  . Alcohol Use: No   OB History    No data available     Review of Systems  Constitutional: Positive for fatigue.  Gastrointestinal: Negative.  Negative for abdominal pain.  Genitourinary: Positive for vaginal bleeding, menstrual problem and pelvic pain. Negative for dysuria, urgency and frequency.  Musculoskeletal: Negative for back pain.  Neurological: Positive for dizziness and weakness.  All other systems reviewed and are negative.   Allergies  Review of patient's allergies indicates no known allergies.  Home Medications   Prior to Admission medications   Medication Sig Start Date End Date Taking? Authorizing Provider  predniSONE (DELTASONE) 20 MG tablet 3 Tabs PO  Days 1-3, then 2 tabs PO Days 4-6, then 1 tab PO Day 7-9, then Half Tab PO Day 10-12 07/09/15   Hayden Rasmussenavid Mabe, NP  traMADol (ULTRAM) 50 MG tablet Take 1 tablet (50 mg total) by mouth every 6 (six) hours as needed. 07/09/15   Hayden Rasmussenavid Mabe, NP   Meds Ordered and Administered this Visit  Medications - No data to display  BP 106/64 mmHg  Pulse 72  Temp(Src) 98.3 F (36.8 C) (Oral)  Resp 16  SpO2 99%  LMP  (Approximate) No data found.   Physical Exam  Constitutional: She is oriented to person, place, and time. She appears well-developed and well-nourished. No distress.  Cardiovascular: Normal heart sounds.   Abdominal: Soft. Bowel sounds are normal. She exhibits no distension and no mass. There is no tenderness. There is no rebound and no guarding.  Neurological: She is alert and oriented to person, place, and time.  Skin: Skin is warm and dry.  Nursing note and vitals reviewed.   ED Course  Procedures (including critical care time)  Labs Review Labs Reviewed  POCT URINALYSIS DIP (DEVICE) - Abnormal; Notable for the following:    Hgb urine dipstick MODERATE (*)    Leukocytes, UA TRACE (*)    All other components within normal limits  POCT PREGNANCY, URINE  POCT I-STAT, CHEM 8   i-stat wnl with hgb 14.6 Imaging Review No results found.   Visual Acuity Review  Right Eye Distance:   Left Eye Distance:   Bilateral Distance:    Right Eye Near:   Left Eye Near:    Bilateral Near:  MDM   1. DUB (dysfunctional uterine bleeding)        Linna Hoff, MD 09/01/15 [phone]

## 2015-09-01 NOTE — MAU Note (Signed)
Pt reports vaginal bleeding x3 months since the insertion of an IUD. Today the bleeding was much heavier. Denies pain. Provider in triage.

## 2015-09-01 NOTE — Discharge Instructions (Signed)
[facility]

## 2015-10-05 ENCOUNTER — Other Ambulatory Visit (HOSPITAL_COMMUNITY)
Admission: RE | Admit: 2015-10-05 | Discharge: 2015-10-05 | Disposition: A | Discharge status: Home / Self Care | Payer: Medicaid Other | Source: Ambulatory Visit | Attending: Obstetrics and Gynecology | Admitting: Obstetrics and Gynecology

## 2015-10-05 ENCOUNTER — Ambulatory Visit (INDEPENDENT_AMBULATORY_CARE_PROVIDER_SITE_OTHER): Payer: Medicaid Other | Admitting: Advanced Practice Midwife

## 2015-10-05 ENCOUNTER — Encounter: Payer: Self-pay | Admitting: Advanced Practice Midwife

## 2015-10-05 VITALS — BP 116/71 | HR 64 | Temp 97.4°F | Ht 60.0 in | Wt 149.0 lb

## 2015-10-05 DIAGNOSIS — N898 Other specified noninflammatory disorders of vagina: Secondary | ICD-10-CM | POA: Diagnosis not present

## 2015-10-05 DIAGNOSIS — Z13220 Encounter for screening for lipoid disorders: Secondary | ICD-10-CM | POA: Insufficient documentation

## 2015-10-05 DIAGNOSIS — L298 Other pruritus: Secondary | ICD-10-CM | POA: Diagnosis not present

## 2015-10-05 DIAGNOSIS — R102 Pelvic and perineal pain: Secondary | ICD-10-CM | POA: Diagnosis not present

## 2015-10-05 DIAGNOSIS — Z113 Encounter for screening for infections with a predominantly sexual mode of transmission: Secondary | ICD-10-CM | POA: Diagnosis not present

## 2015-10-05 DIAGNOSIS — Z30011 Encounter for initial prescription of contraceptive pills: Secondary | ICD-10-CM

## 2015-10-05 LAB — POCT URINALYSIS DIP (DEVICE)
Bilirubin Urine: NEGATIVE
GLUCOSE, UA: NEGATIVE mg/dL
HGB URINE DIPSTICK: NEGATIVE
KETONES UR: NEGATIVE mg/dL
Leukocytes, UA: NEGATIVE
Nitrite: NEGATIVE
PROTEIN: NEGATIVE mg/dL
SPECIFIC GRAVITY, URINE: 1.025 (ref 1.005–1.030)
Urobilinogen, UA: 0.2 mg/dL (ref 0.0–1.0)
pH: 7 (ref 5.0–8.0)

## 2015-10-05 MED ORDER — NORGESTIMATE-ETH ESTRADIOL 0.25-35 MG-MCG PO TABS: 1.0000 | ORAL_TABLET | Freq: Every day | ORAL | Status: DC

## 2015-10-05 NOTE — Progress Notes (Signed)
[employer] interpreter [phone] used for check in

## 2015-10-05 NOTE — Patient Instructions (Signed)

## 2015-10-05 NOTE — Progress Notes (Signed)
  GYNECOLOGY CLINIC ANNUAL PREVENTATIVE CARE ENCOUNTER NOTE  Subjective:   Sheri Perez is a 24 y.o. No obstetric history on file. female here for contraception initiation. Wants to start OCPs.  Current complaints: pelvic pain for a few weeks.   Denies abnormal vaginal bleeding, discharge, problems with intercourse or other gynecologic concerns.    Gynecologic History No LMP recorded. Patient is not currently having periods (Reason: IUD). Contraception: none and IUD removed because of bleeding Last Pap: not sure. Results were: n/a   Obstetric History OB History  No data available    History reviewed. No pertinent past medical history.  History reviewed. No pertinent past surgical history.  No current outpatient prescriptions on file prior to visit.   No current facility-administered medications on file prior to visit.    No Known Allergies  Social History   Social History  . Marital Status: Single    Spouse Name: N/A  . Number of Children: N/A  . Years of Education: N/A   Occupational History  . Not on file.   Social History Main Topics  . Smoking status: Never Smoker   . Smokeless tobacco: Not on file  . Alcohol Use: No  . Drug Use: No  . Sexual Activity: Yes   Other Topics Concern  . Not on file   Social History Narrative    History reviewed. No pertinent family history.  The following portions of the patient's history were reviewed and updated as appropriate: allergies, current medications, past family history, past medical history, past social history, past surgical history and problem list.  Review of Systems Pertinent items are noted in HPI.   Objective:  BP 116/71 mmHg  Pulse 64  Temp(Src) 97.4 F (36.3 C) (Oral)  Ht 5' (1.524 m)  Wt 149 lb (67.586 kg)  BMI 29.10 kg/m2 CONSTITUTIONAL: Well-developed, well-nourished female in no acute distress.   NECK: Normal range of motion, supple, no masses.  Normal thyroid.  SKIN: Skin is warm  and dry. No rash noted. Not diaphoretic. No erythema. No pallor. NEUROLGIC: Alert and oriented to person, place, and time. Normal reflexes, muscle tone. PSYCHIATRIC: Normal mood and affect. Normal behavior. Normal judgment and thought content. CARDIOVASCULAR: Normal heart rate noted RESPIRATORY: no problems with respiration noted. BREASTS: Symmetric in size. No masses, skin changes, nipple drainage, or lymphadenopathy. ABDOMEN: Soft, normal bowel sounds, no distention noted.  No tenderness, rebound or guarding.  PELVIC: Normal appearing external genitalia; normal appearing vaginal mucosa and cervix.  No abnormal discharge noted.  Pap smear obtained.  Normal uterine size, no other palpable masses, mild uterine and adnexal tenderness. MUSCULOSKELETAL: Normal range of motion. No tenderness.  No cyanosis, clubbing, or edema.  2+ distal pulses.   Assessment:  Needs new contraception, oral contraception intiation Pelvic pain, new   Plan:  Reviewed all forms of birth control options available including abstinence; over the counter/barrier methods; hormonal contraceptive medication including pill, patch, ring, injection,contraceptive implant; hormonal and nonhormonal IUDs; permanent sterilization options including vasectomy and the various tubal sterilization modalities. Risks and benefits reviewed.  Questions were answered.  Information was given to patient to review.  Routine preventative health maintenance measures emphasized. Please refer to After Visit Summary for other counseling recommendations.   Rx sent for Sprintec OCPS and reviewed how to take them using phone interpretor Confirmed no hypertension or smoking

## 2015-10-06 ENCOUNTER — Telehealth: Payer: Self-pay | Admitting: General Practice

## 2015-10-06 ENCOUNTER — Encounter: Payer: Medicaid Other | Admitting: Obstetrics and Gynecology

## 2015-10-06 LAB — GC/CHLAMYDIA PROBE AMP (~~LOC~~) NOT AT ARMC
Chlamydia: NEGATIVE
NEISSERIA GONORRHEA: NEGATIVE

## 2015-10-06 LAB — WET PREP, GENITAL
Trich, Wet Prep: NONE SEEN
Yeast Wet Prep HPF POC: NONE SEEN

## 2015-10-06 NOTE — Telephone Encounter (Signed)
Per [name], patient needs appt for BP check in one month (recently started OCPs). Called patient with [employer] interpreter [phone], no answer- left message stating we are trying to reach you in regards to an appt, please call us back at the clinics

## 2015-10-12 ENCOUNTER — Ambulatory Visit (HOSPITAL_COMMUNITY)
Admission: RE | Admit: 2015-10-12 | Discharge: 2015-10-12 | Disposition: A | Discharge status: Home / Self Care | Payer: Medicaid Other | Source: Ambulatory Visit | Attending: Advanced Practice Midwife | Admitting: Advanced Practice Midwife

## 2015-10-12 DIAGNOSIS — R102 Pelvic and perineal pain: Secondary | ICD-10-CM

## 2015-10-15 ENCOUNTER — Ambulatory Visit (HOSPITAL_COMMUNITY)
Admission: RE | Admit: 2015-10-15 | Discharge: 2015-10-15 | Disposition: A | Discharge status: Home / Self Care | Payer: Medicaid Other | Source: Ambulatory Visit | Attending: Advanced Practice Midwife | Admitting: Advanced Practice Midwife

## 2015-10-15 DIAGNOSIS — R102 Pelvic and perineal pain: Secondary | ICD-10-CM | POA: Diagnosis not present

## 2015-11-01 ENCOUNTER — Encounter: Payer: Self-pay | Admitting: Internal Medicine

## 2015-11-01 ENCOUNTER — Ambulatory Visit: Payer: Medicaid Other | Attending: Internal Medicine | Admitting: Internal Medicine

## 2015-11-01 VITALS — BP 112/74 | HR 67 | Temp 98.0°F | Resp 16 | Wt 150.8 lb

## 2015-11-01 DIAGNOSIS — Z79899 Other long term (current) drug therapy: Secondary | ICD-10-CM | POA: Diagnosis not present

## 2015-11-01 DIAGNOSIS — L700 Acne vulgaris: Secondary | ICD-10-CM | POA: Diagnosis not present

## 2015-11-01 NOTE — Progress Notes (Signed)
Interpreter line used Sheri Perez ID# [id number] Patient complains of having a lot of acne on her face and would Like a referral to the dermatologist

## 2015-11-01 NOTE — Progress Notes (Signed)
Patient ID: Sheri Perez, female   DOB: December 23, 1990, 25 y.o.   MRN: [id number]  CC: acne  HPI: Sheri Perez is a 25 y.o. female here today for a follow up visit.  Patient has no past medical history. She states that she has a long history of facial acne and would like a referral to dermatology. Reports that she only uses Cetaphil and Neutrogenia for facial cleansers now. She is concerned because she is now getting painful bumps on her face.   No Known Allergies History reviewed. No pertinent past medical history. Current Outpatient Prescriptions on File Prior to Visit  Medication Sig Dispense Refill  . norgestimate-ethinyl estradiol (ORTHO-CYCLEN,SPRINTEC,PREVIFEM) 0.25-35 MG-MCG tablet Take 1 tablet by mouth daily. 1 Package 11   No current facility-administered medications on file prior to visit.   History reviewed. No pertinent family history. Social History   Social History  . Marital Status: Single    Spouse Name: N/A  . Number of Children: N/A  . Years of Education: N/A   Occupational History  . Not on file.   Social History Main Topics  . Smoking status: Never Smoker   . Smokeless tobacco: Not on file  . Alcohol Use: No  . Drug Use: No  . Sexual Activity: Yes   Other Topics Concern  . Not on file   Social History Narrative    Review of Systems: Other than what is stated in HPI, all other systems are negative.   Objective:   Filed Vitals:   11/01/15 1642  BP: 112/74  Pulse: 67  Temp: 98 F (36.7 C)  Resp: 16    Physical Exam  Constitutional: She is oriented to person, place, and time.  Neurological: She is alert and oriented to person, place, and time.  Skin: Skin is warm and dry. No erythema.  Acne scattered on face. Some cyst and scarring present  Psychiatric: She has a normal mood and affect.     Lab Results  Component Value Date   WBC 9.9 01/26/2011   HGB 14.6 09/01/2015   HCT 43.0 09/01/2015   MCV 82.0 01/26/2011   PLT 178 01/26/2011   Lab Results  Component Value Date   CREATININE 0.50 09/01/2015   BUN 15 09/01/2015   NA 141 09/01/2015   K 3.6 09/01/2015   CL 104 09/01/2015    No results found for: HGBA1C Lipid Panel  No results found for: CHOL, TRIG, HDL, CHOLHDL, VLDL, LDLCALC     Assessment and plan:   Sheri Perez was seen today for referral.  Diagnoses and all orders for this visit:  Acne vulgaris -     Ambulatory referral to Dermatology  Due to language barrier, an interpreter was present during the history-taking and subsequent discussion (and for part of the physical exam) with this patient.  Return if symptoms worsen or fail to improve.       Ambrose FinlandValerie A Myer Bohlman, NP-C [facility] [phone] 11/01/2015, 4:50 PM

## 2015-11-08 ENCOUNTER — Ambulatory Visit: Payer: Medicaid Other

## 2015-11-08 VITALS — BP 110/68 | Wt 148.0 lb

## 2015-11-08 DIAGNOSIS — Z0130 Encounter for examination of blood pressure without abnormal findings: Secondary | ICD-10-CM

## 2015-11-08 NOTE — Progress Notes (Signed)
Pt here today for BP check following new BCP prescription.  BP WNL.  Pt requested refills on her BCP; I explained with Spanish interpreter Moise Boring that she just needs to contact her [facility] off [address] because the provider has written for 11 refills.  Pt asked for results of [place].  I informed her that her [place] results was normal.  Pt stated that she is currently having a white discharge that is itchy.  I recommended that she could use Monistat over the counter.  I gave pt handwritten Monistat on paper so pt would be able to get someone to help her locate at pharmacy.  Pt had no further questions.

## 2016-10-25 ENCOUNTER — Encounter (HOSPITAL_COMMUNITY): Payer: Self-pay | Admitting: Emergency Medicine

## 2016-10-25 ENCOUNTER — Ambulatory Visit (HOSPITAL_COMMUNITY)
Admission: EM | Admit: 2016-10-25 | Discharge: 2016-10-25 | Disposition: A | Discharge status: Home / Self Care | Payer: Medicaid Other | Attending: Internal Medicine | Admitting: Internal Medicine

## 2016-10-25 DIAGNOSIS — N9489 Other specified conditions associated with female genital organs and menstrual cycle: Secondary | ICD-10-CM | POA: Diagnosis not present

## 2016-10-25 LAB — POCT PREGNANCY, URINE: PREG TEST UR: NEGATIVE

## 2016-10-25 LAB — POCT URINALYSIS DIP (DEVICE)
BILIRUBIN URINE: NEGATIVE
Glucose, UA: NEGATIVE mg/dL
Hgb urine dipstick: NEGATIVE
KETONES UR: NEGATIVE mg/dL
Nitrite: NEGATIVE
PH: 6 (ref 5.0–8.0)
PROTEIN: NEGATIVE mg/dL
Urobilinogen, UA: 0.2 mg/dL (ref 0.0–1.0)

## 2016-10-25 NOTE — Discharge Instructions (Signed)
You do not currently have a UTI infection, based on your symptoms I recommend you follow up [facility] the [facility] [place]. I recommend you schedule an appoint and follow up with them. Should your symptoms fail to improve, follow up with a primary care provider, gynecology, or return to clinic.

## 2016-10-25 NOTE — ED Triage Notes (Addendum)
[facility] with a complaint of lower abdominal pain, low back pain  and dysuria x 2 weeks.

## 2016-10-25 NOTE — ED Provider Notes (Signed)
CSN: [id number]     Arrival date & time 10/25/16  1530 History   First MD Initiated Contact with Patient 10/25/16 1749     Chief Complaint  Patient presents with  . Abdominal Pain  . Dysuria   (Consider location/radiation/quality/duration/timing/severity/associated sxs/prior Treatment) 26 year old female presents with chief complaint of spotting, abdominal cramping, and bloating. She reports these symptoms have been ongoing for one week. She reports she is 5 days late for her period, does not use protection, and is sexually active. She has no discharge, no odor, no difficulty with urination, no pain with intercourse. She has no fever, nausea, or vomiting.   The history is provided by the patient.  Abdominal Pain  Associated symptoms: dysuria   Dysuria  Associated symptoms: abdominal pain     History reviewed. No pertinent past medical history. History reviewed. No pertinent surgical history. History reviewed. No pertinent family history. Social History  Substance Use Topics  . Smoking status: Never Smoker  . Smokeless tobacco: Not on file  . Alcohol use No   OB History    No data available     Review of Systems  Reason unable to perform ROS: As covered in HPI.  Gastrointestinal: Positive for abdominal pain.  Genitourinary: Positive for dysuria.  All other systems reviewed and are negative.   Allergies  Patient has no known allergies.  Home Medications   Prior to Admission medications   Not on File   Meds Ordered and Administered this Visit  Medications - No data to display  BP 105/68 (BP Location: Left Arm)   Pulse 61   Temp 98.2 F (36.8 C) (Oral)   Resp 16   LMP 09/23/2016 (Exact Date)   SpO2 100%  No data found.   Physical Exam  Constitutional: She is oriented to person, place, and time. She appears well-developed and well-nourished.  Cardiovascular: Normal rate and regular rhythm.   Pulmonary/Chest: Effort normal and breath sounds normal.   Abdominal: Soft. Bowel sounds are normal. She exhibits no distension. There is no tenderness. There is no guarding. No hernia.  Neurological: She is alert and oriented to person, place, and time.  Skin: Skin is warm and dry. Capillary refill takes less than 2 seconds.  Nursing note and vitals reviewed.   Urgent Care Course   Clinical Course     Procedures (including critical care time)  Labs Review Labs Reviewed  POCT URINALYSIS DIP (DEVICE) - Abnormal; Notable for the following:       Result Value   Leukocytes, UA SMALL (*)    All other components within normal limits  POCT PREGNANCY, URINE    Imaging Review No results found.   Visual Acuity Review  Right Eye Distance:   Left Eye Distance:   Bilateral Distance:    Right Eye Near:   Left Eye Near:    Bilateral Near:         MDM   1. Uterine cramping   You most likely do not currently have a UTI and are not pregnant, based on your symptoms I recommend you follow up with the [facility] here in [place]. I recommend you schedule an appoint and follow up with them. Should your symptoms fail to improve, follow up with a primary care provider, gynecology, or return to clinic.      Dorena BodoLawrence Arleigh Odowd, NP 10/25/16 [phone]

## 2017-06-04 ENCOUNTER — Encounter: Payer: Self-pay | Admitting: *Deleted

## 2017-06-04 ENCOUNTER — Ambulatory Visit (INDEPENDENT_AMBULATORY_CARE_PROVIDER_SITE_OTHER): Payer: Medicaid Other | Admitting: *Deleted

## 2017-06-04 DIAGNOSIS — Z3490 Encounter for supervision of normal pregnancy, unspecified, unspecified trimester: Secondary | ICD-10-CM

## 2017-06-04 DIAGNOSIS — Z3201 Encounter for pregnancy test, result positive: Secondary | ICD-10-CM

## 2017-06-04 NOTE — Progress Notes (Signed)
Pt in for pregnancy test. Results are positive. Pt not currently taking any medications, advised her to consult with md office prior to starting any new medications. She was advised to start a prenatal vitamin. Certain lmp on 04/30/17. Pt is [redacted]w[redacted]d. Pt given pregnancy verification letter.

## 2017-06-12 ENCOUNTER — Inpatient Hospital Stay (HOSPITAL_COMMUNITY): Payer: Medicaid Other

## 2017-06-12 ENCOUNTER — Encounter (HOSPITAL_COMMUNITY): Payer: Self-pay | Admitting: *Deleted

## 2017-06-12 ENCOUNTER — Inpatient Hospital Stay (HOSPITAL_COMMUNITY)
Admission: AD | Admit: 2017-06-12 | Discharge: 2017-06-12 | Disposition: A | Discharge status: Home / Self Care | Payer: Medicaid Other | Source: Ambulatory Visit | Attending: Family Medicine | Admitting: Family Medicine

## 2017-06-12 DIAGNOSIS — O209 Hemorrhage in early pregnancy, unspecified: Secondary | ICD-10-CM | POA: Diagnosis not present

## 2017-06-12 DIAGNOSIS — O3481 Maternal care for other abnormalities of pelvic organs, first trimester: Secondary | ICD-10-CM | POA: Diagnosis not present

## 2017-06-12 DIAGNOSIS — Z3491 Encounter for supervision of normal pregnancy, unspecified, first trimester: Secondary | ICD-10-CM | POA: Insufficient documentation

## 2017-06-12 DIAGNOSIS — Z3A01 Less than 8 weeks gestation of pregnancy: Secondary | ICD-10-CM | POA: Diagnosis not present

## 2017-06-12 HISTORY — DX: Other specified health status: Z78.9

## 2017-06-12 LAB — URINALYSIS, ROUTINE W REFLEX MICROSCOPIC
Bilirubin Urine: NEGATIVE
GLUCOSE, UA: NEGATIVE mg/dL
Hgb urine dipstick: NEGATIVE
KETONES UR: NEGATIVE mg/dL
Leukocytes, UA: NEGATIVE
NITRITE: NEGATIVE
PH: 6 (ref 5.0–8.0)
Protein, ur: NEGATIVE mg/dL
SPECIFIC GRAVITY, URINE: 1.018 (ref 1.005–1.030)

## 2017-06-12 LAB — CBC
HCT: 36.8 % (ref 36.0–46.0)
Hemoglobin: 12.7 g/dL (ref 12.0–15.0)
MCH: 31.1 pg (ref 26.0–34.0)
MCHC: 34.5 g/dL (ref 30.0–36.0)
MCV: 90.2 fL (ref 78.0–100.0)
Platelets: 161 10*3/uL (ref 150–400)
RBC: 4.08 MIL/uL (ref 3.87–5.11)
RDW: 13.2 % (ref 11.5–15.5)
WBC: 10.7 10*3/uL — ABNORMAL HIGH (ref 4.0–10.5)

## 2017-06-12 LAB — WET PREP, GENITAL
SPERM: NONE SEEN
Trich, Wet Prep: NONE SEEN
Yeast Wet Prep HPF POC: NONE SEEN

## 2017-06-12 LAB — HCG, QUANTITATIVE, PREGNANCY: hCG, Beta Chain, Quant, S: 24684 m[IU]/mL — ABNORMAL HIGH (ref <5)

## 2017-06-12 NOTE — MAU Provider Note (Signed)
History     CSN: [id number]  Arrival date and time: 06/12/17 1539  First Provider Initiated Contact with Patient 06/12/17 1807      Chief Complaint  Patient presents with  . Vaginal Bleeding   HPI Sheri Perez is a 26 y.o. G2P1001 at [redacted]w[redacted]d by LMP who presents with vaginal bleeding. Reports red/brown spotting on toilet paper since yesterday evening. Denies abdominal pain, vaginal discharge, vomiting, constipation, dysuria, or recent intercourse. Not bleeding into pad & not passing clots. Plans on going to [facility] for prenatal care.   Spanish interpreter at bedside.   OB History    Gravida Para Term Preterm AB Living   2 1 1  0   1   SAB TAB Ectopic Multiple Live Births           1      Past Medical History:  Diagnosis Date  . Medical history non-contributory     Past Surgical History:  Procedure Laterality Date  . NO PAST SURGERIES      History reviewed. No pertinent family history.  Social History  Substance Use Topics  . Smoking status: Never Smoker  . Smokeless tobacco: Never Used  . Alcohol use No    Allergies: No Known Allergies  Prescriptions Prior to Admission  Medication Sig Dispense Refill Last Dose  . Prenatal Vit-Fe Fumarate-FA (PRENATAL MULTIVITAMIN) TABS tablet Take 1 tablet by mouth daily at 12 noon.   06/11/2017 at Unknown time    Review of Systems  Constitutional: Negative.   Gastrointestinal: Negative.   Genitourinary: Positive for vaginal bleeding. Negative for dysuria and vaginal discharge.   Physical Exam   Blood pressure 114/62, pulse 63, temperature 98.2 F (36.8 C), temperature source Oral, resp. rate 16, weight 146 lb 12 oz (66.6 kg), last menstrual period 04/30/2017, SpO2 100 %.  Physical Exam  Nursing note and vitals reviewed. Constitutional: She is oriented to person, place, and time. She appears well-developed and well-nourished. No distress.  HENT:  Head: Normocephalic and atraumatic.  Eyes: Conjunctivae are normal.  Right eye exhibits no discharge. Left eye exhibits no discharge. No scleral icterus.  Neck: Normal range of motion.  Respiratory: Effort normal. No respiratory distress.  GI: Soft. There is no tenderness.  Genitourinary: Uterus normal. Cervix exhibits no motion tenderness and no friability. Right adnexum displays no mass and no tenderness. Left adnexum displays no mass and no tenderness. There is bleeding (small amount of dark red mucoid blood; no active bleeding) in the vagina.  Genitourinary Comments: Cervix closed  Neurological: She is alert and oriented to person, place, and time.  Skin: Skin is warm and dry. She is not diaphoretic.  Psychiatric: She has a normal mood and affect. Her behavior is normal. Judgment and thought content normal.    MAU Course  Procedures Results for orders placed or performed during the hospital encounter of 06/12/17 (from the past 24 hour(s))  Urinalysis, Routine w reflex microscopic     Status: None   Collection Time: 06/12/17  3:41 PM  Result Value Ref Range   Color, Urine YELLOW YELLOW   APPearance CLEAR CLEAR   Specific Gravity, Urine 1.018 1.005 - 1.030   pH 6.0 5.0 - 8.0   Glucose, UA NEGATIVE NEGATIVE mg/dL   Hgb urine dipstick NEGATIVE NEGATIVE   Bilirubin Urine NEGATIVE NEGATIVE   Ketones, ur NEGATIVE NEGATIVE mg/dL   Protein, ur NEGATIVE NEGATIVE mg/dL   Nitrite NEGATIVE NEGATIVE   Leukocytes, UA NEGATIVE NEGATIVE  CBC  Status: Abnormal   Collection Time: 06/12/17  5:28 PM  Result Value Ref Range   WBC 10.7 (H) 4.0 - 10.5 K/uL   RBC 4.08 3.87 - 5.11 MIL/uL   Hemoglobin 12.7 12.0 - 15.0 g/dL   HCT 16.1 09.6 - 04.5 %   MCV 90.2 78.0 - 100.0 fL   MCH 31.1 26.0 - 34.0 pg   MCHC 34.5 30.0 - 36.0 g/dL   RDW 40.9 81.1 - 91.4 %   Platelets 161 150 - 400 K/uL  hCG, quantitative, pregnancy     Status: Abnormal   Collection Time: 06/12/17  5:28 PM  Result Value Ref Range   hCG, Beta Chain, Quant, S 24,684 (H) <5 mIU/mL  Wet prep, genital      Status: Abnormal   Collection Time: 06/12/17  6:21 PM  Result Value Ref Range   Yeast Wet Prep HPF POC NONE SEEN NONE SEEN   Trich, Wet Prep NONE SEEN NONE SEEN   Clue Cells Wet Prep HPF POC PRESENT (A) NONE SEEN   WBC, Wet Prep HPF POC MANY (A) NONE SEEN   Sperm NONE SEEN    US Ob Comp Less 14 Wks  Result Date: 06/12/2017 CLINICAL DATA:  Pregnant and bleeding. EXAM: OBSTETRIC <14 WK [place] AND TRANSVAGINAL OB US TECHNIQUE: Both transabdominal and transvaginal ultrasound examinations were performed for complete evaluation of the gestation as well as the maternal uterus, adnexal regions, and pelvic cul-de-sac. Transvaginal technique was performed to assess early pregnancy. COMPARISON:  None. FINDINGS: Intrauterine gestational sac: Present Yolk sac:  Present Embryo:  Present Cardiac Activity: Present Heart Rate: 92  bpm MSD:   mm    w     d CRL:  1.5  mm   5 w   3 d                  [place] EDC: February 09, 2018 Subchorionic hemorrhage:  None visualized. Maternal uterus/adnexae: Left ovary is normal. Corpus luteal cyst measuring 1.5 cm is identified in the right ovary. Trace free fluid is identified in the pelvis. IMPRESSION: Single live intrauterine gestation measuring to 5 weeks and 3 days. No complications. Electronically Signed   By: Sherian Rein M.D.   On: 06/12/2017 19:45   US Ob Transvaginal  Result Date: 06/12/2017 CLINICAL DATA:  Pregnant and bleeding. EXAM: OBSTETRIC <14 WK [place] AND TRANSVAGINAL OB US TECHNIQUE: Both transabdominal and transvaginal ultrasound examinations were performed for complete evaluation of the gestation as well as the maternal uterus, adnexal regions, and pelvic cul-de-sac. Transvaginal technique was performed to assess early pregnancy. COMPARISON:  None. FINDINGS: Intrauterine gestational sac: Present Yolk sac:  Present Embryo:  Present Cardiac Activity: Present Heart Rate: 92  bpm MSD:   mm    w     d CRL:  1.5  mm   5 w   3 d                  [place] EDC: February 09, 2018  Subchorionic hemorrhage:  None visualized. Maternal uterus/adnexae: Left ovary is normal. Corpus luteal cyst measuring 1.5 cm is identified in the right ovary. Trace free fluid is identified in the pelvis. IMPRESSION: Single live intrauterine gestation measuring to 5 weeks and 3 days. No complications. Electronically Signed   By: Sherian Rein M.D.   On: 06/12/2017 19:45    MDM +UPT UA, wet prep, GC/chlamydia, CBC, ABO/Rh, quant hCG, HIV, and [place] today to rule out ectopic pregnancy O positive Ultrasound  shows SIUP with cardiac activity of 92 Assessment and Plan  A: 1. Normal IUP (intrauterine pregnancy) on prenatal ultrasound, first trimester   2. Vaginal bleeding in pregnancy, first trimester    P: Discharge home Pelvic rest F/u outpatient ultrasound in 10 days d/t FHT <100 Discussed reasons to return to MAU GC/CT pending  Judeth Horn 06/12/2017, 6:04 PM

## 2017-06-12 NOTE — Discharge Instructions (Signed)
Hemorragia vaginal durante el embarazo (primer trimestre) (Vaginal Bleeding During Pregnancy, First Trimester) Durante los primeros meses del embarazo es relativamente frecuente que se presente una pequea hemorragia (manchas). Esta situacin generalmente mejora por s misma. Estas hemorragias o manchas tienen diversas causas al inicio del embarazo. Algunas manchas pueden estar relacionadas al [employer] y otras no. En la [employer], la hemorragia es normal y no es un problema. Sin embargo, la hemorragia tambin puede ser un signo de algo grave. Debe informar a su mdico de inmediato si tiene alguna hemorragia vaginal. Algunas causas posibles de hemorragia vaginal durante el primer trimestre incluyen:  Infeccin o inflamacin del cuello del tero.  Crecimientos anormales (plipos) en el cuello del tero.  Aborto espontneo o amenaza de aborto espontneo.  Tejido del Psychiatrist se ha desarrollado fuera del tero y en una trompa de falopio (embarazo ectpico).  Se han desarrollado pequeos quistes en el tero en lugar de tejido de embarazo (embarazo molar). INSTRUCCIONES PARA EL CUIDADO EN EL HOGAR Controle su afeccin para ver si hay cambios. Las siguientes indicaciones ayudarn a Psychologist, educational [employer] pueda sentir:  Siga las indicaciones del mdico para restringir su actividad. Si el mdico le indica descanso en la cama, debe quedarse en la cama y levantarse solo para ir al bao. No obstante, el mdico puede permitirle que continu con tareas livianas.  Si es necesario, organcese para que alguien le ayude con las actividades y responsabilidades cotidianas mientras est en cama.  Lleve un registro de la cantidad y la saturacin de las toallas higinicas que Landscape architect. Anote este dato.  No use tampones.No se haga duchas vaginales.  No tenga relaciones sexuales u orgasmos hasta que el mdico la autorice.  Si elimina tejido por la vagina, gurdelo para mostrrselo al  [employer].  [place] solo medicamentos de venta libre o recetados, segn las indicaciones del mdico.  No tome aspirina, ya que puede causar hemorragias.  Cumpla con todas las visitas de control, segn le indique su mdico. SOLICITE ATENCIN MDICA SI:  Tiene un sangrado vaginal en cualquier momento del embarazo.  Tiene calambres o dolores de [place].  Tiene fiebre que los medicamentos no Sports coach. SOLICITE ATENCIN MDICA DE INMEDIATO SI:  Siente calambres intensos en la espalda o en el vientre (abdomen).  Elimina cogulos grandes o tejido por la vagina.  La hemorragia aumenta.  Si se siente mareada, dbil o se desmaya.  Tiene escalofros.  Tiene una prdida importante o sale lquido a borbotones por la vagina.  Se desmaya mientras defeca. ASEGRESE DE QUE:  Comprende estas instrucciones.  Controlar su afeccin.  Recibir ayuda de inmediato si no mejora o si empeora. Esta informacin no tiene Theme park manager el consejo del mdico. Asegrese de hacerle al mdico cualquier pregunta que tenga. Document Released: 07/12/2005 Document Revised: 10/07/2013 Document Reviewed: 06/09/2013 Elsevier Interactive Patient Education  [employer].

## 2017-06-12 NOTE — MAU Note (Signed)
Urine in lab 

## 2017-06-12 NOTE — MAU Note (Signed)
Started bleeding this morning, light.  No pain at this time, had some yesterday.

## 2017-06-13 LAB — GC/CHLAMYDIA PROBE AMP (~~LOC~~) NOT AT ARMC
Chlamydia: NEGATIVE
NEISSERIA GONORRHEA: NEGATIVE

## 2017-06-22 ENCOUNTER — Ambulatory Visit (HOSPITAL_COMMUNITY)
Admission: RE | Admit: 2017-06-22 | Discharge: 2017-06-22 | Disposition: A | Discharge status: Home / Self Care | Payer: Medicaid Other | Source: Ambulatory Visit | Attending: Student | Admitting: Student

## 2017-06-22 ENCOUNTER — Ambulatory Visit (INDEPENDENT_AMBULATORY_CARE_PROVIDER_SITE_OTHER): Payer: Self-pay | Admitting: *Deleted

## 2017-06-22 ENCOUNTER — Ambulatory Visit (HOSPITAL_COMMUNITY): Payer: Self-pay

## 2017-06-22 DIAGNOSIS — O209 Hemorrhage in early pregnancy, unspecified: Secondary | ICD-10-CM | POA: Diagnosis not present

## 2017-06-22 DIAGNOSIS — O3680X0 Pregnancy with inconclusive fetal viability, not applicable or unspecified: Secondary | ICD-10-CM | POA: Insufficient documentation

## 2017-06-22 DIAGNOSIS — Z3490 Encounter for supervision of normal pregnancy, unspecified, unspecified trimester: Secondary | ICD-10-CM

## 2017-06-22 DIAGNOSIS — Z3491 Encounter for supervision of normal pregnancy, unspecified, first trimester: Secondary | ICD-10-CM

## 2017-06-22 DIAGNOSIS — Z3A01 Less than 8 weeks gestation of pregnancy: Secondary | ICD-10-CM | POA: Diagnosis not present

## 2017-06-22 NOTE — Progress Notes (Signed)
Here to get results of Sheri Perez. Results reviewed with Dr. Debroah LoopArnold and patient . She c/o still has a little bleeding when she wipes and a little on her pad. Discussed with Dr. Debroah LoopArnold and explained she still may have a little bleeding for a few days/ weeks due to subchorionic hemorrhage. Advised her to go to mau if has heavy bleeding, otherwise start prenatal care . She has an appt in our office in October when she is 12 weeks. Reviewed meds with her. Also discussing dating with her; she thought she might not be as far as 7 weeks but we discussed crown rump length is accurate and she can discuss further at ob appt. She voices understanding.

## 2017-07-23 ENCOUNTER — Encounter: Payer: Self-pay | Admitting: Advanced Practice Midwife

## 2017-07-23 ENCOUNTER — Other Ambulatory Visit (HOSPITAL_COMMUNITY)
Admission: RE | Admit: 2017-07-23 | Discharge: 2017-07-23 | Disposition: A | Discharge status: Home / Self Care | Payer: Medicaid Other | Source: Ambulatory Visit | Attending: Advanced Practice Midwife | Admitting: Advanced Practice Midwife

## 2017-07-23 ENCOUNTER — Ambulatory Visit (INDEPENDENT_AMBULATORY_CARE_PROVIDER_SITE_OTHER): Payer: Medicaid Other | Admitting: Clinical

## 2017-07-23 ENCOUNTER — Ambulatory Visit (INDEPENDENT_AMBULATORY_CARE_PROVIDER_SITE_OTHER): Payer: Medicaid Other | Admitting: Advanced Practice Midwife

## 2017-07-23 VITALS — BP 110/63 | HR 65 | Wt 150.3 lb

## 2017-07-23 DIAGNOSIS — Z124 Encounter for screening for malignant neoplasm of cervix: Secondary | ICD-10-CM

## 2017-07-23 DIAGNOSIS — Z3480 Encounter for supervision of other normal pregnancy, unspecified trimester: Secondary | ICD-10-CM | POA: Diagnosis present

## 2017-07-23 DIAGNOSIS — Z1151 Encounter for screening for human papillomavirus (HPV): Secondary | ICD-10-CM

## 2017-07-23 DIAGNOSIS — Z3481 Encounter for supervision of other normal pregnancy, first trimester: Secondary | ICD-10-CM

## 2017-07-23 DIAGNOSIS — F4321 Adjustment disorder with depressed mood: Secondary | ICD-10-CM | POA: Diagnosis not present

## 2017-07-23 DIAGNOSIS — Z113 Encounter for screening for infections with a predominantly sexual mode of transmission: Secondary | ICD-10-CM | POA: Diagnosis not present

## 2017-07-23 DIAGNOSIS — Z833 Family history of diabetes mellitus: Secondary | ICD-10-CM | POA: Insufficient documentation

## 2017-07-23 LAB — POCT URINALYSIS DIP (DEVICE)
BILIRUBIN URINE: NEGATIVE
Glucose, UA: NEGATIVE mg/dL
HGB URINE DIPSTICK: NEGATIVE
KETONES UR: NEGATIVE mg/dL
Nitrite: NEGATIVE
PH: 7 (ref 5.0–8.0)
Protein, ur: NEGATIVE mg/dL
Specific Gravity, Urine: 1.02 (ref 1.005–1.030)
Urobilinogen, UA: 0.2 mg/dL (ref 0.0–1.0)

## 2017-07-23 NOTE — BH Specialist Note (Signed)
Integrated Behavioral Health Initial Visit  MRN: [id number] Name:[id number] Stenzel  Number of Integrated Behavioral Health Clinician visits:: 1/6 Session Start time: 10:45  Session End time: 11:15 Total time: 30 minutes  Type of Service: Integrated Behavioral Health- Individual/Family Interpretor:No. Interpretor Name and Language: n/a   Warm Hand Off Completed.       SUBJECTIVE: Sheri Perez is a 26 y.o. female accompanied by Spanish interpreter Patient was referred by Thressa Sheller, CNM for depression. Patient reports the following symptoms/concerns: Pt states her primary symptoms today are fatigue, followed by difficulty relaxing; pt coped with feelings of situational depression after her oldest child was born, by talking to a psychologist, but has not established with therapy after moving to [facility]. Warm baths also help to cope with changing emotions.  Duration of problem:; Severity of problem: moderate  OBJECTIVE: Mood: Depressed and Affect: Depressed and Tearful Risk of harm to self or others: No plan to harm self or others  LIFE CONTEXT: Family and Social: Lives with husband and 6yo School/Work: - Self-Care: Warm baths for self-care Life Changes: Current pregnancy  GOALS ADDRESSED: Patient will: 1. Reduce symptoms of: depression 2. Increase knowledge and/or ability of: self-management skills  3. Demonstrate ability to: Increase healthy adjustment to current life circumstances  INTERVENTIONS: Interventions utilized: Solution-Focused Strategies and Psychoeducation and/or Health Education  Standardized Assessments completed: GAD-7 and PHQ 9  ASSESSMENT: Patient currently experiencing Adjustment disorder with depressed mood.   Patient may benefit from psychoeducation and brief therapeutic intervention regarding coping with symptoms of depression.  PLAN: 1. Follow up with behavioral health clinician on : One month 2. Behavioral  recommendations:  -Take a warm bath daily; continue for as long as it remains helpful -Take walk outside, for at least one minute, daily -Consider either [facility] or [facility], to establish care for ongoing therapy -Read educational material regarding coping with symptoms of depression 3. Referral(s): Integrated [employer] (In Clinic) and Counselor 4. "From scale of 1-10, how likely are you to follow plan?": 8  Rae Lips, LCSWA  Depression screen [facility] 2/9 07/23/2017 01/11/2015  Decreased Interest 3 0  Down, Depressed, Hopeless 2 0  PHQ - 2 Score 5 0  Altered sleeping 0 -  Tired, decreased energy 3 -  Change in appetite 1 -  Feeling bad or failure about yourself  1 -  Trouble concentrating 2 -  Moving slowly or fidgety/restless 0 -  Suicidal thoughts 0 -  PHQ-9 Score 12 -  Difficult doing work/chores Somewhat difficult -   GAD 7 : Generalized Anxiety Score 07/23/2017  Nervous, Anxious, on Edge 0  Control/stop worrying 0  Worry too much - different things 0  Trouble relaxing 3  Restless 0  Easily annoyed or irritable 2  Afraid - awful might happen 0  Total GAD 7 Score 5

## 2017-07-23 NOTE — Progress Notes (Signed)
  Subjective:    Sheri Perez is being seen today for her first obstetrical visit.  This is a planned pregnancy. She is at [redacted]w[redacted]d gestation. Her obstetrical history is significant for none. Relationship with FOB: spouse, living together. Patient does intend to breast feed. Breast fed her fist baby.  Pregnancy history fully reviewed.  Patient reports that her father has DM II, and is on oral medications, will get early 1 hour GTT today.  Patient reports no complaints.  Review of Systems:   Review of Systems  Constitutional: Negative for chills and fever.  Gastrointestinal: Positive for nausea. Negative for vomiting.  Genitourinary: Negative for dysuria, frequency, pelvic pain, urgency, vaginal bleeding and vaginal discharge.    Objective:     BP 110/63   Pulse 65   Wt 150 lb 4.8 oz (68.2 kg)   LMP 05/04/2017   BMI 29.35 kg/m  Physical Exam  Vitals reviewed. Constitutional: She is oriented to person, place, and time. She appears well-developed and well-nourished. No distress.  HENT:  Head: Normocephalic.  Cardiovascular: Normal rate.   Respiratory: Effort normal.  GI: Soft. There is no tenderness. There is no rebound.  Genitourinary:  Genitourinary Comments:  External: no lesion Vagina: small amount of white discharge Cervix: pink, smooth, no CMT Uterus: AGA, FHT 160 with doppler    Neurological: She is alert and oriented to person, place, and time.  Skin: Skin is warm and dry.  Psychiatric: She has a normal mood and affect.    Exam    Assessment:    Pregnancy: G2P1001 There are no active problems to display for this patient.      Plan:     Initial labs drawn. Early 1 hour GTT today  Prenatal vitamins. Problem list reviewed and updated. AFP3 discussed: requested.  Role of ultrasound in pregnancy discussed; fetal survey: requested. Amniocentesis discussed: not indicated. Follow up in 4 weeks. 50% of 40 min visit spent on counseling and  coordination of care.     Thressa Sheller 07/23/2017

## 2017-07-23 NOTE — Patient Instructions (Signed)

## 2017-07-24 LAB — CYTOLOGY - PAP
CHLAMYDIA, DNA PROBE: NEGATIVE
Diagnosis: NEGATIVE
NEISSERIA GONORRHEA: NEGATIVE

## 2017-07-25 LAB — OBSTETRIC PANEL, INCLUDING HIV
Antibody Screen: NEGATIVE
BASOS ABS: 0 10*3/uL (ref 0.0–0.2)
Basos: 0 %
EOS (ABSOLUTE): 0.1 10*3/uL (ref 0.0–0.4)
EOS: 1 %
HIV Screen 4th Generation wRfx: NONREACTIVE
Hematocrit: 37.2 % (ref 34.0–46.6)
Hemoglobin: 12.5 g/dL (ref 11.1–15.9)
Hepatitis B Surface Ag: NEGATIVE
Immature Grans (Abs): 0.1 10*3/uL (ref 0.0–0.1)
Immature Granulocytes: 1 %
LYMPHS ABS: 2.4 10*3/uL (ref 0.7–3.1)
Lymphs: 27 %
MCH: 30.3 pg (ref 26.6–33.0)
MCHC: 33.6 g/dL (ref 31.5–35.7)
MCV: 90 fL (ref 79–97)
MONOS ABS: 0.9 10*3/uL (ref 0.1–0.9)
Monocytes: 10 %
Neutrophils Absolute: 5.7 10*3/uL (ref 1.4–7.0)
Neutrophils: 61 %
PLATELETS: 206 10*3/uL (ref 150–379)
RBC: 4.12 x10E6/uL (ref 3.77–5.28)
RDW: 14.5 % (ref 12.3–15.4)
RH TYPE: POSITIVE
RPR Ser Ql: NONREACTIVE
Rubella Antibodies, IGG: 3.88 index (ref >0.99)
WBC: 9.1 10*3/uL (ref 3.4–10.8)

## 2017-07-25 LAB — HEMOGLOBINOPATHY EVALUATION
HEMOGLOBIN A2 QUANTITATION: 2.2 % (ref 1.8–3.2)
HEMOGLOBIN F QUANTITATION: 0 % (ref 0.0–2.0)
HGB A: 97.8 % (ref 96.4–98.8)
HGB C: 0 %
HGB S: 0 %
HGB VARIANT: 0 %

## 2017-07-25 LAB — CULTURE, OB URINE

## 2017-07-25 LAB — HEMOGLOBIN A1C
Est. average glucose Bld gHb Est-mCnc: 103 mg/dL
Hgb A1c MFr Bld: 5.2 % (ref 4.8–5.6)

## 2017-07-25 LAB — GLUCOSE TOLERANCE, 1 HOUR: Glucose, 1Hr PP: 88 mg/dL (ref 65–199)

## 2017-07-25 LAB — URINE CULTURE, OB REFLEX

## 2017-07-30 ENCOUNTER — Encounter: Payer: Self-pay | Admitting: Advanced Practice Midwife

## 2017-07-30 DIAGNOSIS — Z3480 Encounter for supervision of other normal pregnancy, unspecified trimester: Secondary | ICD-10-CM | POA: Insufficient documentation

## 2017-07-31 ENCOUNTER — Encounter: Payer: Self-pay | Admitting: *Deleted

## 2017-08-03 ENCOUNTER — Encounter: Payer: Self-pay | Admitting: *Deleted

## 2017-08-20 ENCOUNTER — Ambulatory Visit (INDEPENDENT_AMBULATORY_CARE_PROVIDER_SITE_OTHER): Payer: Medicaid Other | Admitting: Advanced Practice Midwife

## 2017-08-20 ENCOUNTER — Other Ambulatory Visit (HOSPITAL_COMMUNITY)
Admission: RE | Admit: 2017-08-20 | Discharge: 2017-08-20 | Disposition: A | Discharge status: Home / Self Care | Payer: Medicaid Other | Source: Ambulatory Visit | Attending: Advanced Practice Midwife | Admitting: Advanced Practice Midwife

## 2017-08-20 VITALS — BP 107/59 | HR 63 | Wt 150.6 lb

## 2017-08-20 DIAGNOSIS — O26891 Other specified pregnancy related conditions, first trimester: Secondary | ICD-10-CM | POA: Insufficient documentation

## 2017-08-20 DIAGNOSIS — M5432 Sciatica, left side: Secondary | ICD-10-CM | POA: Diagnosis not present

## 2017-08-20 DIAGNOSIS — N898 Other specified noninflammatory disorders of vagina: Secondary | ICD-10-CM | POA: Insufficient documentation

## 2017-08-20 DIAGNOSIS — Z3480 Encounter for supervision of other normal pregnancy, unspecified trimester: Secondary | ICD-10-CM

## 2017-08-20 DIAGNOSIS — Z23 Encounter for immunization: Secondary | ICD-10-CM

## 2017-08-20 NOTE — Progress Notes (Signed)
   PRENATAL VISIT NOTE  Subjective:  Sheri Perez is a 26 y.o. G2P1001 at 8538w3d being seen today for ongoing prenatal care.  She is currently monitored for the following issues for this low-risk pregnancy and has Family history of diabetes mellitus in father and Supervision of other normal pregnancy, antepartum on their problem list.  Patient reports vaginal discharge and sciatic pain. She reports a yellow discharge for 1 week with occasional vaginal irritation.  Contractions: Irregular. Vag. Bleeding: None.  Movement: Absent. Denies leaking of fluid.   The following portions of the patient's history were reviewed and updated as appropriate: allergies, current medications, past family history, past medical history, past social history, past surgical history and problem list. Problem list updated.  Objective:   Vitals:   08/20/17 1053  BP: (!) 107/59  Pulse: 63  Weight: 150 lb 9.6 oz (68.3 kg)    Fetal Status: Fetal Heart Rate (bpm): 134   Movement: Absent     General:  Alert, oriented and cooperative. Patient is in no acute distress.  Skin: Skin is warm and dry. No rash noted.   Cardiovascular: Normal heart rate noted  Respiratory: Normal respiratory effort, no problems with respiration noted  Abdomen: Soft, gravid, appropriate for gestational age.  Pain/Pressure: Present     Pelvic: Cervical exam performed Dilation: Closed Effacement (%): Thick Station: Ballotable  Extremities: Normal range of motion.  Edema: None  Mental Status:  Normal mood and affect. Normal behavior. Normal judgment and thought content.   Assessment and Plan:  Pregnancy: G2P1001 at 3038w3d  1. Supervision of other normal pregnancy, antepartum -AFP today -Anatomy ultrasound -Discussed maternity support belt -Flu shot today  2. Vaginal discharge during pregnancy in first trimester -Wet prep today  3. Sciatica of left side -Exercises reviewed -Encouraged tylenol for pain relief  Preterm labor  symptoms and general obstetric precautions including but not limited to vaginal bleeding, contractions, leaking of fluid and fetal movement were reviewed in detail with the patient. Please refer to After Visit Summary for other counseling recommendations.  Return in about 4 weeks (around 09/17/2017) for Return OB.   Rolm BookbinderCaroline M Katherine Syme, CNM  08/20/17 11:30 AM

## 2017-08-20 NOTE — Progress Notes (Signed)
Pt stated having cramping/pain LLQ when more active.

## 2017-08-21 LAB — CERVICOVAGINAL ANCILLARY ONLY
Bacterial vaginitis: NEGATIVE
Candida vaginitis: NEGATIVE
Trichomonas: NEGATIVE

## 2017-08-22 LAB — AFP TETRA
DIA MOM VALUE: 1.42
DIA Value (EIA): 253.95 pg/mL
DSR (BY AGE) 1 IN: 835
DSR (Second Trimester) 1 IN: 9718
Gestational Age: 15.3 WEEKS
MATERNAL AGE AT EDD: 28.3 a
MSAFP MOM: 1.26
MSAFP: 36.7 ng/mL
MSHCG MOM: 0.46
MSHCG: 21884 m[IU]/mL
OSB RISK: 5411
T18 (By Age): 1:3252 {titer}
TEST RESULTS AFP: NEGATIVE
Weight: 150 [lb_av]
uE3 Mom: 1.48
uE3 Value: 0.91 ng/mL

## 2017-09-12 ENCOUNTER — Encounter (HOSPITAL_COMMUNITY): Payer: Self-pay

## 2017-09-17 ENCOUNTER — Ambulatory Visit (INDEPENDENT_AMBULATORY_CARE_PROVIDER_SITE_OTHER): Payer: Medicaid Other | Admitting: Obstetrics and Gynecology

## 2017-09-17 ENCOUNTER — Encounter: Payer: Self-pay | Admitting: Obstetrics and Gynecology

## 2017-09-17 VITALS — BP 97/51 | HR 67 | Wt 155.0 lb

## 2017-09-17 DIAGNOSIS — R51 Headache: Secondary | ICD-10-CM

## 2017-09-17 DIAGNOSIS — Z3482 Encounter for supervision of other normal pregnancy, second trimester: Secondary | ICD-10-CM

## 2017-09-17 DIAGNOSIS — Z3480 Encounter for supervision of other normal pregnancy, unspecified trimester: Secondary | ICD-10-CM

## 2017-09-17 DIAGNOSIS — R519 Headache, unspecified: Secondary | ICD-10-CM

## 2017-09-17 NOTE — Progress Notes (Signed)
   PRENATAL VISIT NOTE - interpretor used  Subjective:  Sheri Perez is a 26 y.o. G2P1001 at 8268w3d being seen today for ongoing prenatal care.  She is currently monitored for the following issues for this low-risk pregnancy and has Family history of diabetes mellitus in father and Supervision of other normal pregnancy, antepartum on their problem list.  Patient reports headaches and some dizziness.  Contractions: Not present. Vag. Bleeding: None.  Movement: Present. Denies leaking of fluid.   The following portions of the patient's history were reviewed and updated as appropriate: allergies, current medications, past family history, past medical history, past social history, past surgical history and problem list. Problem list updated.  Objective:   Vitals:   09/17/17 1132  BP: (!) 97/51  Pulse: 67  Weight: 155 lb (70.3 kg)    Fetal Status: Fetal Heart Rate (bpm): 143   Movement: Present     General:  Alert, oriented and cooperative. Patient is in no acute distress.  Skin: Skin is warm and dry. No rash noted.   Cardiovascular: Normal heart rate noted  Respiratory: Normal respiratory effort, no problems with respiration noted  Abdomen: Soft, gravid, appropriate for gestational age.  Pain/Pressure: Absent     Pelvic: Cervical exam deferred        Extremities: Normal range of motion.  Edema: None  Mental Status:  Normal mood and affect. Normal behavior. Normal judgment and thought content.   Assessment and Plan:  Pregnancy: G2P1001 at 2368w3d  1. Supervision of other normal pregnancy, antepartum Scheduled for anatomy 09/28/17  2. Nonintractable headache, unspecified chronicity pattern, unspecified headache type/dizziness Reviewed appropriate dose of tylenol, other strategies for improving headaches and dizziness   Preterm labor symptoms and general obstetric precautions including but not limited to vaginal bleeding, contractions, leaking of fluid and fetal movement were  reviewed in detail with the patient. Please refer to After Visit Summary for other counseling recommendations.  Return in about 4 weeks (around 10/15/2017) for OB visit.   Conan BowensKelly M Davis, MD

## 2017-09-17 NOTE — Progress Notes (Signed)
Spanish interpreter "Meta HatchetGustavo" # [phone] used for visit

## 2017-09-18 ENCOUNTER — Other Ambulatory Visit: Payer: Self-pay

## 2017-09-18 ENCOUNTER — Ambulatory Visit (HOSPITAL_COMMUNITY)
Admission: RE | Admit: 2017-09-18 | Discharge: 2017-09-18 | Disposition: A | Discharge status: Home / Self Care | Payer: Medicaid Other | Source: Ambulatory Visit

## 2017-09-18 DIAGNOSIS — Z3689 Encounter for other specified antenatal screening: Secondary | ICD-10-CM | POA: Insufficient documentation

## 2017-09-18 DIAGNOSIS — Z3A19 19 weeks gestation of pregnancy: Secondary | ICD-10-CM

## 2017-09-18 DIAGNOSIS — Z3480 Encounter for supervision of other normal pregnancy, unspecified trimester: Secondary | ICD-10-CM

## 2017-10-15 ENCOUNTER — Ambulatory Visit (INDEPENDENT_AMBULATORY_CARE_PROVIDER_SITE_OTHER): Payer: Medicaid Other | Admitting: Obstetrics and Gynecology

## 2017-10-15 VITALS — BP 105/58 | HR 72 | Wt 157.0 lb

## 2017-10-15 DIAGNOSIS — Z789 Other specified health status: Secondary | ICD-10-CM

## 2017-10-15 DIAGNOSIS — B373 Candidiasis of vulva and vagina: Secondary | ICD-10-CM

## 2017-10-15 DIAGNOSIS — O9989 Other specified diseases and conditions complicating pregnancy, childbirth and the puerperium: Secondary | ICD-10-CM

## 2017-10-15 DIAGNOSIS — Z603 Acculturation difficulty: Secondary | ICD-10-CM | POA: Insufficient documentation

## 2017-10-15 DIAGNOSIS — B3731 Acute candidiasis of vulva and vagina: Secondary | ICD-10-CM

## 2017-10-15 MED ORDER — MICONAZOLE NITRATE 2 % VA CREA: 1.0000 | TOPICAL_CREAM | Freq: Every day | VAGINAL | 0 refills | Status: DC

## 2017-10-15 NOTE — Progress Notes (Signed)
Prenatal Visit Note Date: 10/15/2017 Clinic: [facility]  Subjective:  Sheri Perez is a 26 y.o. G2P1001 at 7933w3d being seen today for ongoing prenatal care.  She is currently monitored for the following issues for this low-risk pregnancy and has Family history of diabetes mellitus in father and Supervision of other normal pregnancy, antepartum on their problem list.  Patient reports watery d/c for the past 3wks. she only notices it when she walks and hasn't gotten any worse or better since it began. s/s began since last u/s. also notes some low belly pressure. she denies any vaginal itching or malodorous urine.  Contractions: Irritability. Vag. Bleeding: None.  Movement: Present. Denies leaking of fluid.   The following portions of the patient's history were reviewed and updated as appropriate: allergies, current medications, past family history, past medical history, past social history, past surgical history and problem list. Problem list updated.  Objective:   Vitals:   10/15/17 0901  BP: (!) 105/58  Pulse: 72  Weight: 157 lb (71.2 kg)    Fetal Status: Fetal Heart Rate (bpm): 157 Fundal Height: 24 cm Movement: Present     General:  Alert, oriented and cooperative. Patient is in no acute distress.  Skin: Skin is warm and dry. No rash noted.   Cardiovascular: Normal heart rate noted  Respiratory: Normal respiratory effort, no problems with respiration noted  Abdomen: Soft, gravid, appropriate for gestational age. Pain/Pressure: Present     Pelvic:  Cervical exam performed Dilation: Closed Effacement (%): Thick    Extremities: Normal range of motion.  Edema: None  Mental Status: Normal mood and affect. Normal behavior. Normal judgment and thought content.  SSE: EGBUS normal. Vaginal vault with white, cottage cheese like d/c in vault, no VB or fluid. Negative cough test. cx visually closed.  Urinalysis:      Assessment and Plan:  Pregnancy:  G2P1001 at 7933w3d  1. Vulvovaginal candidiasis Likely VVC and RL discomfort. Recommend OTC monistat and f/u s/s later next week. Consider swab nv if s/s persist.  Interpreter used  Preterm labor symptoms and general obstetric precautions including but not limited to vaginal bleeding, contractions, leaking of fluid and fetal movement were reviewed in detail with the patient. Please refer to After Visit Summary for other counseling recommendations.  Return in about 8 days (around 10/23/2017) for 8-10d low risk ob.   [place] BingPickens, Chasitie Passey, MD

## 2017-10-15 NOTE — Progress Notes (Signed)
Pt stated about 3 weeks having a lot pressure/discomfort lower abdominal and watery discharge when urinating especially when walking.

## 2017-10-16 NOTE — L&D Delivery Note (Signed)
Patient is 27 y.o. G2P1001 2061w6d admitted for PROM. S/p augmentation with Pitocin.  Prenatal course also uncomplicated.  Delivery Note At 7:30 AM a viable female was delivered via Vaginal, Spontaneous (Presentation: ROA).  APGAR: 8, 9; weight pending.   Placenta status: Intact,.  Cord: 3V with the following complications: marginal insertion.  Cord pH: N/A  Anesthesia: Epidural  Episiotomy: Median Lacerations: 2nd degree Suture Repair: 3.0 vicryl Est. Blood Loss (mL): 250  Mom to postpartum.  Baby to Couplet care / Skin to Skin.  Caryl AdaJazma Erica Richwine, DO 01/31/2018, 8:04 AM

## 2017-10-23 ENCOUNTER — Ambulatory Visit (INDEPENDENT_AMBULATORY_CARE_PROVIDER_SITE_OTHER): Payer: Medicaid Other | Admitting: Obstetrics and Gynecology

## 2017-10-23 VITALS — BP 96/71 | HR 79 | Wt 159.0 lb

## 2017-10-23 DIAGNOSIS — B373 Candidiasis of vulva and vagina: Secondary | ICD-10-CM

## 2017-10-23 DIAGNOSIS — B3731 Acute candidiasis of vulva and vagina: Secondary | ICD-10-CM

## 2017-10-23 DIAGNOSIS — Z3480 Encounter for supervision of other normal pregnancy, unspecified trimester: Secondary | ICD-10-CM

## 2017-10-23 NOTE — Progress Notes (Signed)
   PRENATAL VISIT NOTE  Subjective:  Sheri Perez is a 27 y.o. G2P1001 at 7368w4d being seen today for ongoing prenatal care.  She is currently monitored for the following issues for this low-risk pregnancy and has Family history of diabetes mellitus in father; Supervision of other normal pregnancy, antepartum; Vulvovaginal candidiasis; and Language barrier on their problem list.  Patient reports no complaints.  Contractions: Not present. Vag. Bleeding: None.  Movement: Absent. Denies leaking of fluid.   The following portions of the patient's history were reviewed and updated as appropriate: allergies, current medications, past family history, past medical history, past social history, past surgical history and problem list. Problem list updated.  Objective:   Vitals:   10/23/17 0934  BP: 96/71  Pulse: 79  Weight: 159 lb (72.1 kg)    Fetal Status: Fetal Heart Rate (bpm): 152   Movement: Absent     General:  Alert, oriented and cooperative. Patient is in no acute distress.  Skin: Skin is warm and dry. No rash noted.   Cardiovascular: Normal heart rate noted  Respiratory: Normal respiratory effort, no problems with respiration noted  Abdomen: Soft, gravid, appropriate for gestational age.  Pain/Pressure: Absent     Pelvic: Cervical exam deferred        Extremities: Normal range of motion.  Edema: None  Mental Status:  Normal mood and affect. Normal behavior. Normal judgment and thought content.   Assessment and Plan:  Pregnancy: G2P1001 at 5868w4d   1. Vulvovaginal candidiasis  Patient was treated and symptoms have resolved  2. Supervision of other normal pregnancy, antepartum  Doing well, no complaints   There are no diagnoses linked to this encounter. Preterm labor symptoms and general obstetric precautions including but not limited to vaginal bleeding, contractions, leaking of fluid and fetal movement were reviewed in detail with the patient. Please refer to After  Visit Summary for other counseling recommendations.  No Follow-up on file.   Rasch, Harolyn RutherfordJennifer I, NP   Asa SaunasNathan J Bates, Student-PA

## 2017-10-26 ENCOUNTER — Ambulatory Visit (INDEPENDENT_AMBULATORY_CARE_PROVIDER_SITE_OTHER): Payer: Medicaid Other | Admitting: Obstetrics & Gynecology

## 2017-10-26 VITALS — BP 104/62 | HR 87

## 2017-10-26 DIAGNOSIS — Z3480 Encounter for supervision of other normal pregnancy, unspecified trimester: Secondary | ICD-10-CM

## 2017-10-26 DIAGNOSIS — Z3482 Encounter for supervision of other normal pregnancy, second trimester: Secondary | ICD-10-CM

## 2017-10-26 DIAGNOSIS — O26892 Other specified pregnancy related conditions, second trimester: Secondary | ICD-10-CM | POA: Diagnosis not present

## 2017-10-26 DIAGNOSIS — R109 Unspecified abdominal pain: Secondary | ICD-10-CM

## 2017-10-26 DIAGNOSIS — Z789 Other specified health status: Secondary | ICD-10-CM

## 2017-10-26 NOTE — Progress Notes (Signed)
Pt states has been having pain in stomach & also having contractions, not very strong but if rated 5 out of 10.

## 2017-10-26 NOTE — Progress Notes (Signed)
   PRENATAL VISIT NOTE  Subjective:  Sheri Perez is a 27 y.o. G2P1001 at 5583w0d being seen today for ongoing prenatal care.  She is currently monitored for the following issues for this low-risk pregnancy and has Family history of diabetes mellitus in father; Supervision of other normal pregnancy, antepartum; Vulvovaginal candidiasis; and Language barrier on their problem list.  Patient reports 3 day history of a 10 cm area on her right mid uterus. She works at [employer] in [employer]. She is feeling like she is worried that it too hot for the pregnancy. She denies contractions, vaginal bleding, aor breaking her water..  Contractions: Irregular. Vag. Bleeding: None.  Movement: Present. Denies leaking of fluid.   The following portions of the patient's history were reviewed and updated as appropriate: allergies, current medications, past family history, past medical history, past social history, past surgical history and problem list. Problem list updated.  Objective:   Vitals:   10/26/17 0920  BP: 104/62  Pulse: 87    Fetal Status: Fetal Heart Rate (bpm): 150   Movement: Present     General:  Alert, oriented and cooperative. Patient is in no acute distress.  Skin: Skin is warm and dry. No rash noted.   Cardiovascular: Normal heart rate noted  Respiratory: Normal respiratory effort, no problems with respiration noted  Abdomen: Soft, gravid, appropriate for gestational age.  Pain/Pressure: Present     Pelvic: Cervical exam performed        Extremities: Normal range of motion.  Edema: None  Mental Status:  Normal mood and affect. Normal behavior. Normal judgment and thought content.  NST reactive, no contractions  Assessment and Plan:  Pregnancy: G2P1001 at 3383w0d  1. Supervision of other normal pregnancy, antepartum - NST today was reactive and showed no contractions. I offered to write her out of work for a week, but I cannot write her out for the entire  pregnancy. Reassurance about the health of the baby given  2. Language barrier Video interpretor used for the encounter  Preterm labor symptoms and general obstetric precautions including but not limited to vaginal bleeding, contractions, leaking of fluid and fetal movement were reviewed in detail with the patient. Please refer to After Visit Summary for other counseling recommendations.  No Follow-up on file.   Allie BossierMyra C Alani Sabbagh, MD

## 2017-11-23 ENCOUNTER — Other Ambulatory Visit (HOSPITAL_COMMUNITY)
Admission: RE | Admit: 2017-11-23 | Discharge: 2017-11-23 | Disposition: A | Discharge status: Home / Self Care | Payer: Medicaid Other | Source: Ambulatory Visit | Attending: Obstetrics and Gynecology | Admitting: Obstetrics and Gynecology

## 2017-11-23 ENCOUNTER — Ambulatory Visit (INDEPENDENT_AMBULATORY_CARE_PROVIDER_SITE_OTHER): Payer: Medicaid Other | Admitting: Obstetrics and Gynecology

## 2017-11-23 VITALS — BP 104/59 | HR 81 | Wt 164.6 lb

## 2017-11-23 DIAGNOSIS — Z3483 Encounter for supervision of other normal pregnancy, third trimester: Secondary | ICD-10-CM

## 2017-11-23 DIAGNOSIS — Z3480 Encounter for supervision of other normal pregnancy, unspecified trimester: Secondary | ICD-10-CM | POA: Insufficient documentation

## 2017-11-23 DIAGNOSIS — Z23 Encounter for immunization: Secondary | ICD-10-CM | POA: Diagnosis not present

## 2017-11-23 NOTE — Progress Notes (Signed)
   PRENATAL VISIT NOTE  Subjective:  Sheri Perez is a 27 y.o. G2P1001 at [redacted]w[redacted]d being seen today for ongoing prenatal care.  She is currently monitored for the following issues for this low-risk pregnancy and has Family history of diabetes mellitus in father; Supervision of other normal pregnancy, antepartum; Vulvovaginal candidiasis; and Language barrier on their problem list.  Patient reports no complaints.  Contractions: Not present. Vag. Bleeding: None.  Movement: Present. Denies leaking of fluid. Patient not fasting today for 2 hour GTT  The following portions of the patient's history were reviewed and updated as appropriate: allergies, current medications, past family history, past medical history, past social history, past surgical history and problem list. Problem list updated.  Objective:   Vitals:   11/23/17 1138  BP: (!) 104/59  Pulse: 81  Weight: 164 lb 9.6 oz (74.7 kg)    Fetal Status: Fetal Heart Rate (bpm): 141 Fundal Height: 30 cm Movement: Present     General:  Alert, oriented and cooperative. Patient is in no acute distress.  Skin: Skin is warm and dry. No rash noted.   Cardiovascular: Normal heart rate noted  Respiratory: Normal respiratory effort, no problems with respiration noted  Abdomen: Soft, gravid, appropriate for gestational age.  Pain/Pressure: Present     Pelvic: Cervical exam deferred        Extremities: Normal range of motion.  Edema: None  Mental Status:  Normal mood and affect. Normal behavior. Normal judgment and thought content.   Assessment and Plan:  Pregnancy: G2P1001 at [redacted]w[redacted]d  1. Supervision of other normal pregnancy, antepartum  - CBC - RPR - HIV antibody - Tdap vaccine greater than or equal to 7yo IM  Preterm labor symptoms and general obstetric precautions including but not limited to vaginal bleeding, contractions, leaking of fluid and fetal movement were reviewed in detail with the patient. Please refer to After Visit  Summary for other counseling recommendations.  Return in about 1 week (around 11/30/2017) for Schedule an appointment for 2 hour GTT- come fasting .   Venia CarbonJennifer Deavion Dobbs, NP

## 2017-11-23 NOTE — Progress Notes (Signed)
Pt accepted Tdap,given in left arm @11 :49 on 11/23/17.

## 2017-11-23 NOTE — Patient Instructions (Signed)

## 2017-11-24 LAB — CBC
Hematocrit: 36.6 % (ref 34.0–46.6)
Hemoglobin: 11.6 g/dL (ref 11.1–15.9)
MCH: 30.9 pg (ref 26.6–33.0)
MCHC: 31.7 g/dL (ref 31.5–35.7)
MCV: 97 fL (ref 79–97)
PLATELETS: 166 10*3/uL (ref 150–379)
RBC: 3.76 x10E6/uL — AB (ref 3.77–5.28)
RDW: 14 % (ref 12.3–15.4)
WBC: 10 10*3/uL (ref 3.4–10.8)

## 2017-11-24 LAB — RPR: RPR: NONREACTIVE

## 2017-11-24 LAB — HIV ANTIBODY (ROUTINE TESTING W REFLEX): HIV Screen 4th Generation wRfx: NONREACTIVE

## 2017-11-26 LAB — CERVICOVAGINAL ANCILLARY ONLY
Chlamydia: NEGATIVE
NEISSERIA GONORRHEA: NEGATIVE

## 2017-11-28 ENCOUNTER — Other Ambulatory Visit: Payer: Medicaid Other

## 2017-11-28 DIAGNOSIS — Z3483 Encounter for supervision of other normal pregnancy, third trimester: Secondary | ICD-10-CM

## 2017-11-29 LAB — GLUCOSE TOLERANCE, 2 HOURS W/ 1HR
GLUCOSE, 1 HOUR: 148 mg/dL (ref 65–179)
GLUCOSE, FASTING: 76 mg/dL (ref 65–91)
Glucose, 2 hour: 148 mg/dL (ref 65–152)

## 2017-12-18 ENCOUNTER — Ambulatory Visit (INDEPENDENT_AMBULATORY_CARE_PROVIDER_SITE_OTHER): Payer: Medicaid Other | Admitting: Obstetrics and Gynecology

## 2017-12-18 ENCOUNTER — Encounter: Payer: Self-pay | Admitting: Obstetrics and Gynecology

## 2017-12-18 VITALS — BP 103/62 | HR 86 | Wt 165.6 lb

## 2017-12-18 DIAGNOSIS — Z3480 Encounter for supervision of other normal pregnancy, unspecified trimester: Secondary | ICD-10-CM

## 2017-12-18 DIAGNOSIS — O2240 Hemorrhoids in pregnancy, unspecified trimester: Secondary | ICD-10-CM | POA: Insufficient documentation

## 2017-12-18 DIAGNOSIS — O2243 Hemorrhoids in pregnancy, third trimester: Secondary | ICD-10-CM

## 2017-12-18 MED ORDER — HYDROCORTISONE 2.5 % RE CREA: TOPICAL_CREAM | RECTAL | 1 refills | Status: DC

## 2017-12-18 NOTE — Progress Notes (Signed)
Subjective:  Sheri Perez is a 27 y.o. G2P1001 at 264w4d being seen today for ongoing prenatal care.  She is currently monitored for the following issues for this low-risk pregnancy and has Family history of diabetes mellitus in father; Supervision of other normal pregnancy, antepartum; Language barrier; and Hemorrhoids during pregnancy on their problem list.  Patient reports hemorrhoids.  Contractions: Irritability. Vag. Bleeding: None.  Movement: Present. Denies leaking of fluid.   The following portions of the patient's history were reviewed and updated as appropriate: allergies, current medications, past family history, past medical history, past social history, past surgical history and problem list. Problem list updated.  Objective:   Vitals:   12/18/17 1146  BP: 103/62  Pulse: 86  Weight: 75.1 kg (165 lb 9.6 oz)    Fetal Status: Fetal Heart Rate (bpm): 169   Movement: Present     General:  Alert, oriented and cooperative. Patient is in no acute distress.  Skin: Skin is warm and dry. No rash noted.   Cardiovascular: Normal heart rate noted  Respiratory: Normal respiratory effort, no problems with respiration noted  Abdomen: Soft, gravid, appropriate for gestational age. Pain/Pressure: Present     Pelvic:  Cervical exam deferred        Extremities: Normal range of motion.  Edema: None  Mental Status: Normal mood and affect. Normal behavior. Normal judgment and thought content.   Urinalysis:      Assessment and Plan:  Pregnancy: G2P1001 at 514w4d  1. Supervision of other normal pregnancy, antepartum Stable  2. Hemorrhoids during pregnancy in third trimester Anusol cream to pharmamcy  Preterm labor symptoms and general obstetric precautions including but not limited to vaginal bleeding, contractions, leaking of fluid and fetal movement were reviewed in detail with the patient. Please refer to After Visit Summary for other counseling recommendations.  Return in  about 2 weeks (around 01/01/2018) for OB visit.   Sheri StaggersErvin, Sheri Mcgann L, MD

## 2017-12-18 NOTE — Progress Notes (Signed)
A lot of pain in lower & right abdomen

## 2018-01-01 ENCOUNTER — Ambulatory Visit (INDEPENDENT_AMBULATORY_CARE_PROVIDER_SITE_OTHER): Payer: Medicaid Other | Admitting: Family Medicine

## 2018-01-01 VITALS — BP 108/63 | HR 79 | Wt 170.4 lb

## 2018-01-01 DIAGNOSIS — Z3483 Encounter for supervision of other normal pregnancy, third trimester: Secondary | ICD-10-CM

## 2018-01-01 DIAGNOSIS — Z3480 Encounter for supervision of other normal pregnancy, unspecified trimester: Secondary | ICD-10-CM

## 2018-01-01 NOTE — Progress Notes (Signed)
   PRENATAL VISIT NOTE  Subjective:  Sheri Perez is a 27 y.o. G2P1001 at 2355w4d being seen today for ongoing prenatal care.  She is currently monitored for the following issues for this low-risk pregnancy and has Family history of diabetes mellitus in father; Supervision of other normal pregnancy, antepartum; Language barrier; and Hemorrhoids during pregnancy on their problem list.  Patient reports no complaints.  Contractions: Not present. Vag. Bleeding: None.  Movement: Present. Denies leaking of fluid.   The following portions of the patient's history were reviewed and updated as appropriate: allergies, current medications, past family history, past medical history, past social history, past surgical history and problem list. Problem list updated.  Objective:   Vitals:   01/01/18 1005  BP: 108/63  Pulse: 79  Weight: 77.3 kg (170 lb 6.4 oz)    Fetal Status: Fetal Heart Rate (bpm): 138 Fundal Height: 35 cm Movement: Present     General:  Alert, oriented and cooperative. Patient is in no acute distress.  Skin: Skin is warm and dry. No rash noted.   Cardiovascular: Normal heart rate noted  Respiratory: Normal respiratory effort, no problems with respiration noted  Abdomen: Soft, gravid, appropriate for gestational age.  Pain/Pressure: Present     Pelvic: Cervical exam deferred        Extremities: Normal range of motion.  Edema: Trace  Mental Status:  Normal mood and affect. Normal behavior. Normal judgment and thought content.   Assessment and Plan:  Pregnancy: G2P1001 at 7455w4d  1. Supervision of other normal pregnancy, antepartum  Preterm labor symptoms and general obstetric precautions including but not limited to vaginal bleeding, contractions, leaking of fluid and fetal movement were reviewed in detail with the patient. Please refer to After Visit Summary for other counseling recommendations.  Return in about 1 week (around 01/08/2018).   Rolm BookbinderAmber Kasandra Fehr, DO

## 2018-01-01 NOTE — Progress Notes (Signed)
[facility] [place]

## 2018-01-01 NOTE — Patient Instructions (Signed)
Actividad fsica durante el embarazo (Exercise During Pregnancy) Para las personas de todas las edades, la actividad fsica es un aspecto importante para mantenerse sanas. El ejercicio mejora la actividad cardaca y la funcin pulmonar, y ayuda a mantener la fuerza, la flexibilidad y el peso corporal adecuado. Adems, aumenta los niveles de energa y mejora el estado de nimo. A la mayora de las mujeres se les recomienda seguir una rutina de ejercicios durante el embarazo. Solo en contadas ocasiones y cuando hay determinadas enfermedades o complicaciones del embarazo, es posible que se les recomiende que limiten o eviten la actividad fsica durante el embarazo. QU OTROS BENEFICIOS TIENE LA ACTIVIDAD FSICA DURANTE EL EMBARAZO? Adems de mantener la fuerza y la flexibilidad, la actividad fsica durante el embarazo puede contribuir a lo siguiente:  Mantener la fuerza de los msculos que cumplen un papel muy importante durante el trabajo de parto y el parto.  Aliviar el dolor de la zona lumbar durante el embarazo.  Reducir el riesgo de tener diabetes mellitus gestacional (DMG).  Mejorar el control del nivel de azcar en la sangre (glucosa) en las mujeres que tienen diabetes mellitus gestacional.  Reducir el riesgo de tener preeclampsia, un trastorno grave que causa hipertensin arterial junto con otros sntomas, como hinchazn y dolores de cabeza.  Reducir el riesgo de cesrea.  Acelerar la recuperacin despus del parto. CON QU FRECUENCIA DEBO HACER EJERCICIO? Salvo que el mdico le d otras indicaciones, debe intentar hacer actividad fsica todos los das de la semana o casi todos. En general, intente hacer ejercicio de intensidad moderada durante aproximadamente 150minutos semanales, los cuales pueden distribuirse en varios das, por ejemplo, 30minutos diarios, 5veces por semana. Puede saber que est haciendo ejercicio de intensidad moderada si tiene una frecuencia cardaca ms elevada y  una respiracin ms rpida, pero an puede mantener una conversacin. QU TIPOS DE EJERCICIOS DE INTENSIDAD MODERADA SE RECOMIENDAN DURANTE EL EMBARAZO? Hay muchos tipos de ejercicios que se pueden hacer durante el embarazo. A menos que el mdico le d otras indicaciones, haga diferentes ejercicios que le aumenten la frecuencia cardaca y respiratoria (cardiopulmonar) de forma segura, y la ayuden a desarrollar y mantener la fuerza muscular (entrenamiento de fuerza). Mientras realiza actividad fsica durante el embarazo, siempre debe poder decir frases completas. Algunos ejemplos de actividades fsicas que no suponen un riesgo durante el embarazo incluyen lo siguiente:  Caminatas rpidas o senderismo.  Natacin.  Gimnasia acutica.  Bicicleta fija.  Entrenamiento de fuerza.  Yoga adaptado o Pilates. Infrmele al profesor/a que est embarazada. Evite elongar demasiado y acostarse boca arriba durante mucho tiempo.  Correr o trotar. Solo elija este tipo de actividad fsica si: ? Sola correr o trotar habitualmente antes del embarazo. ? Puede correr o trotar y, aun as, decir frases completas. QU TIPOS DE EJERCICIOS NO DEBO REALIZAR DURANTE EL EMBARAZO? En funcin de su estado fsico y de si realiz actividad fsica habitualmente antes del embarazo, tal vez le recomienden limitar los ejercicios de intensidad vigorosa durante la gestacin. Puede saber que est haciendo ejercicio de intensidad vigorosa si respira con mucha ms dificultad y rapidez, y no puede mantener una conversacin. Algunos ejemplos de actividades fsicas que debe evitar durante el embarazo incluyen lo siguiente:  Deportes de contacto.  Actividades que la ponen en riesgo de sufrir cadas o recibir golpes en el abdomen, por ejemplo, esqu extremo, esqu acutico, surf, escalada en roca, ciclismo, gimnasia y equitacin.  Buceo.  Paracaidismo.  Yoga o Pilates en un recinto donde las temperaturas   son extremadamente  elevadas ("yoga a altas temperaturas" o "Pilates a altas temperaturas").  Trotar o correr, a menos que haya corrido o trotado habitualmente antes del Psychiatristembarazo. Mientras corre o trota, siempre debe poder decir frases completas. No corra ni trote con mucha energa de modo que no pueda mantener una conversacin.  Si no est habituada a hacer actividad fsica a grandes alturas (ms de 6000metros International Business Machinessobre el nivel del mar), no lo haga durante el embarazo. CUNDO DEBO EVITAR LA ACTIVIDAD FSICA DURANTE EL EMBARAZO? Hay algunas afecciones que New York Life Insurancepueden hacer que la actividad fsica durante el Godleyembarazo sea Perryriesgosa, o bien aumentar el riesgo de aborto espontneo o de trabajo de parto y parto prematuros. Algunas de estas afecciones son las siguientes:  Algunos tipos de cardiopatas coronarias.  Algunos tipos de enfermedades pulmonares.  Placenta previa. Esto ocurre cuando la placenta cubre de [place]era parcial o total la abertura del tero (cuello uterino).  Hemorragias vaginales frecuentes durante el embarazo.  Insuficiencia cervicouterina. Esto ocurre cuando el cuello del tero no permanece tan cerrado como debera durante el [place]embarazo.  Parto prematuro.  Rotura de Ophiemmembranas. Esto ocurre cuando el saco de proteccin (saco amnitico) se abre y se produce la prdida del lquido amnitico por la vagina.  Recuento sanguneo muy bajo (anemia).  Preeclampsia o hipertensin arterial especfica del embarazo.  Embarazo de ms de un feto (gestacin mltiple) y el riesgo adicional de parto prematuro.  Diabetes mal controlada.  Muy bajo peso o mucho sobrepeso.  Restriccin del crecimiento intrauterino. Esto ocurre cuando el crecimiento y el desarrollo del feto durante el embarazo son ms lentos de lo previsto.  Otras enfermedades. Pregntele al mdico si alguna de estas afecciones corresponden a su caso. QU MS DEBO SABER SOBRE LA ACTIVIDAD FSICA DURANTE EL EMBARAZO? Debe tomar estas precauciones  mientras hace actividad fsica durante el embarazo:  No se abrigue demasiado. ? Use ropa suelta transpirable. ? No haga actividad fsica cuando las temperaturas son Elise Bennemuy elevadas.  No se deshidrate. Beba mucha agua antes, durante y despus de hacer actividad fsica para Pharmacologistmantener la orina de tono claro o color amarillo plido.  Evite elongar en exceso. Debido a los cambios hormonales que ocurren durante el [place]embarazo, es fcil elongar [employer] Internationalexcesivamente los msculos, los tendones y los ligamentos.  Si no sola realizar actividad fsica con frecuencia, comience despacio y pdale al mdico que le recomiende los tipos de ejercicios que sean seguros para usted. El embarazo no es la poca indicada para hacer actividad fsica con el fin de bajar de Nara Visapeso. CUNDO DEBO BUSCAR ATENCIN MDICA? Debe suspender la actividad fsica y llamar al mdico si tiene sntomas fuera de lo comn, por ejemplo:  Contracciones uterinas leves o clicos abdominales.  Mareos que no mejoran con el reposo. CUNDO DEBO BUSCAR ASISTENCIA MDICA INMEDIATA? Debe suspender la actividad fsica y llamar a los servicios de Associate Professoremergencia de su localidad (911en los [place]) si tiene sntomas fuera de comn, por ejemplo:  Dolor intenso y repentino en la zona lumbar o en el abdomen.  Contracciones uterinas o clicos abdominales que no mejoran con el reposo.  Dolor en el pecho.  Hemorragia o prdida de lquido por la vagina.  Falta de aire. Esta informacin no tiene Theme park managercomo fin reemplazar el consejo del mdico. Asegrese de hacerle al mdico cualquier pregunta que tenga. Document Released: 07/19/2006 Document Revised: 06/23/2015 Document Reviewed: 12/10/2014 Elsevier Interactive Patient Education  2018 [employer]

## 2018-01-11 ENCOUNTER — Ambulatory Visit (INDEPENDENT_AMBULATORY_CARE_PROVIDER_SITE_OTHER): Payer: Medicaid Other | Admitting: Obstetrics and Gynecology

## 2018-01-11 ENCOUNTER — Other Ambulatory Visit (HOSPITAL_COMMUNITY)
Admission: RE | Admit: 2018-01-11 | Discharge: 2018-01-11 | Disposition: A | Discharge status: Home / Self Care | Payer: Medicaid Other | Source: Ambulatory Visit | Attending: Medical | Admitting: Medical

## 2018-01-11 ENCOUNTER — Encounter: Payer: Self-pay | Admitting: Obstetrics and Gynecology

## 2018-01-11 VITALS — BP 103/61 | HR 79 | Wt 169.8 lb

## 2018-01-11 DIAGNOSIS — Z3493 Encounter for supervision of normal pregnancy, unspecified, third trimester: Secondary | ICD-10-CM | POA: Diagnosis present

## 2018-01-11 DIAGNOSIS — Z3480 Encounter for supervision of other normal pregnancy, unspecified trimester: Secondary | ICD-10-CM

## 2018-01-11 NOTE — Progress Notes (Signed)
Subjective:  Sheri Perez is a 27 y.o. G2P1001 at 9626w0d being seen today for ongoing prenatal care.  She is currently monitored for the following issues for this low-risk pregnancy and has Family history of diabetes mellitus in father; Supervision of other normal pregnancy, antepartum; Language barrier; and Hemorrhoids during pregnancy on their problem list.  Patient reports no complaints.  Contractions: Not present. Vag. Bleeding: None.  Movement: Present. Denies leaking of fluid.   The following portions of the patient's history were reviewed and updated as appropriate: allergies, current medications, past family history, past medical history, past social history, past surgical history and problem list. Problem list updated.  Objective:   Vitals:   01/11/18 1031  BP: 103/61  Pulse: 79  Weight: 169 lb 12.8 oz (77 kg)    Fetal Status: Fetal Heart Rate (bpm): 155   Movement: Present     General:  Alert, oriented and cooperative. Patient is in no acute distress.  Skin: Skin is warm and dry. No rash noted.   Cardiovascular: Normal heart rate noted  Respiratory: Normal respiratory effort, no problems with respiration noted  Abdomen: Soft, gravid, appropriate for gestational age. Pain/Pressure: Present     Pelvic:  Cervical exam performed        Extremities: Normal range of motion.  Edema: Trace  Mental Status: Normal mood and affect. Normal behavior. Normal judgment and thought content.   Urinalysis:      Assessment and Plan:  Pregnancy: G2P1001 at 7226w0d  1. Encounter for supervision of normal pregnancy in third trimester, unspecified gravidity Stable Labor precautions - Strep Gp B NAA - Cervicovaginal ancillary only  2. Supervision of other normal pregnancy, antepartum   Term labor symptoms and general obstetric precautions including but not limited to vaginal bleeding, contractions, leaking of fluid and fetal movement were reviewed in detail with the  patient. Please refer to After Visit Summary for other counseling recommendations.  Return in about 1 week (around 01/18/2018) for OB visit.   Hermina StaggersErvin, Sheba Whaling L, MD

## 2018-01-13 LAB — STREP GP B NAA: STREP GROUP B AG: NEGATIVE

## 2018-01-14 LAB — CERVICOVAGINAL ANCILLARY ONLY
Chlamydia: NEGATIVE
Neisseria Gonorrhea: NEGATIVE

## 2018-01-17 ENCOUNTER — Other Ambulatory Visit (HOSPITAL_COMMUNITY)
Admission: RE | Admit: 2018-01-17 | Discharge: 2018-01-17 | Disposition: A | Discharge status: Home / Self Care | Payer: Medicaid Other | Source: Ambulatory Visit | Attending: Obstetrics & Gynecology | Admitting: Obstetrics & Gynecology

## 2018-01-17 ENCOUNTER — Ambulatory Visit (INDEPENDENT_AMBULATORY_CARE_PROVIDER_SITE_OTHER): Payer: Medicaid Other

## 2018-01-17 DIAGNOSIS — N898 Other specified noninflammatory disorders of vagina: Secondary | ICD-10-CM | POA: Diagnosis present

## 2018-01-17 NOTE — Progress Notes (Signed)
Interpreter Sheri Perez Pt presented to the office for vaginal discharge. Pt states that she has been having discharged that resembles cheese three days ago. Self swab was done and sent to the lab. Pt informed that she will receive a phone call when results comes back. Pt verbalized understanding and had no questions.

## 2018-01-17 NOTE — Progress Notes (Signed)
Chart reviewed for nurse visit. Agree with plan of care.   Sharyon CableRogers, Fatmata Legere C, CNM 01/17/2018 3:45 PM

## 2018-01-18 LAB — CERVICOVAGINAL ANCILLARY ONLY
Bacterial vaginitis: NEGATIVE
Candida vaginitis: NEGATIVE

## 2018-01-22 ENCOUNTER — Encounter (HOSPITAL_COMMUNITY): Payer: Self-pay

## 2018-01-22 ENCOUNTER — Inpatient Hospital Stay (HOSPITAL_COMMUNITY)
Admission: AD | Admit: 2018-01-22 | Discharge: 2018-01-22 | Disposition: A | Discharge status: Home / Self Care | Payer: Medicaid Other | Source: Ambulatory Visit | Attending: Obstetrics and Gynecology | Admitting: Obstetrics and Gynecology

## 2018-01-22 ENCOUNTER — Ambulatory Visit (INDEPENDENT_AMBULATORY_CARE_PROVIDER_SITE_OTHER): Payer: Medicaid Other | Admitting: Certified Nurse Midwife

## 2018-01-22 ENCOUNTER — Encounter: Payer: Self-pay | Admitting: Certified Nurse Midwife

## 2018-01-22 ENCOUNTER — Other Ambulatory Visit (HOSPITAL_COMMUNITY)
Admission: RE | Admit: 2018-01-22 | Discharge: 2018-01-22 | Disposition: A | Discharge status: Home / Self Care | Payer: Medicaid Other | Source: Ambulatory Visit | Attending: Certified Nurse Midwife | Admitting: Certified Nurse Midwife

## 2018-01-22 ENCOUNTER — Other Ambulatory Visit: Payer: Self-pay

## 2018-01-22 VITALS — BP 104/65 | HR 79 | Wt 173.6 lb

## 2018-01-22 DIAGNOSIS — Z833 Family history of diabetes mellitus: Secondary | ICD-10-CM

## 2018-01-22 DIAGNOSIS — O479 False labor, unspecified: Secondary | ICD-10-CM

## 2018-01-22 DIAGNOSIS — N898 Other specified noninflammatory disorders of vagina: Secondary | ICD-10-CM

## 2018-01-22 DIAGNOSIS — Z3480 Encounter for supervision of other normal pregnancy, unspecified trimester: Secondary | ICD-10-CM

## 2018-01-22 DIAGNOSIS — Z3A37 37 weeks gestation of pregnancy: Secondary | ICD-10-CM

## 2018-01-22 DIAGNOSIS — Z789 Other specified health status: Secondary | ICD-10-CM

## 2018-01-22 NOTE — MAU Note (Signed)
I have communicated with Renee RivalVirgina Smith, CNM and reviewed vital signs:  Vitals:   01/22/18 1801  BP: 116/74  Pulse: 80  Resp: 16  Temp: 98.4 F (36.9 C)    Vaginal exam:  Dilation: 4 Effacement (%): 60 Cervical Position: Posterior Station: -3 Presentation: Vertex Exam by:: Dorathy KinsmanVirginia Smith, CNM,   Also reviewed contraction pattern and that non-stress test is reactive.  It has been documented that patient is contracting every 3-5 minutes with no cervical change over 1-2 hours not indicating active labor.  Patient denies any other complaints.  Based on this report provider has given order for discharge.  A discharge order and diagnosis entered by a provider.   Labor discharge instructions reviewed with patient.

## 2018-01-22 NOTE — Progress Notes (Incomplete)
PRENATAL VISIT NOTE  Subjective:  Sheri Perez is a 27 y.o. G2P1001 at [redacted]w[redacted]d being seen today for ongoing prenatal care.  She is currently monitored for the following issues for this {Blank single:19197::"high-risk","low-risk"} pregnancy and has Family history of diabetes mellitus in father; Supervision of other normal pregnancy, antepartum; Language barrier; and Hemorrhoids during pregnancy on their problem list.  Patient reports {sx:14538}.  Contractions: Not present. Vag. Bleeding: None.  Movement: Present. Denies leaking of fluid.   The following portions of the patient's history were reviewed and updated as appropriate: allergies, current medications, past family history, past medical history, past social history, past surgical history and problem list. Problem list updated.  Objective:   Vitals:   01/22/18 1626  BP: 104/65  Pulse: 79  Weight: 173 lb 9.6 oz (78.7 kg)    Fetal Status: Fetal Heart Rate (bpm): 142 Fundal Height: 37 cm Movement: Present  Presentation: Vertex  General:  Alert, oriented and cooperative. Patient is in no acute distress.  Skin: Skin is warm and dry. No rash noted.   Cardiovascular: Normal heart rate noted  Respiratory: Normal respiratory effort, no problems with respiration noted  Abdomen: Soft, gravid, appropriate for gestational age.  Pain/Pressure: Present     Pelvic: {Blank single:19197::"Cervical exam performed","Cervical exam deferred"} Dilation: 4 Effacement (%): 50 Station: -3  Extremities: Normal range of motion.  Edema: Trace  Mental Status: Normal mood and affect. Normal behavior. Normal judgment and thought content.   Assessment and Plan:  Pregnancy: G2P1001 at [redacted]w[redacted]d  1. Supervision of other normal pregnancy, antepartum ***  2. Language barrier ***  3. Vaginal discharge *** - Cervicovaginal ancillary only  {Blank single:19197::"Term","Preterm"} labor symptoms and general obstetric precautions including but not  limited to vaginal bleeding, contractions, leaking of fluid and fetal movement were reviewed in detail with the patient. Please refer to After Visit Summary for other counseling recommendations.  No follow-ups on file.  Future Appointments  Date Time Provider Department Center  01/28/2018  4:15 PM Armando Reichert CNM [facility]  02/05/2018 11:15 AM Pincus Large, DO [facility]    Sharyon Cable, CNM  PRENATAL VISIT NOTE  Subjective:  Sheri Perez is a 27 y.o. G2P1001 at [redacted]w[redacted]d being seen today for ongoing prenatal care.  She is currently monitored for the following issues for this {Blank single:19197::"high-risk","low-risk"} pregnancy and has Family history of diabetes mellitus in father; Supervision of other normal pregnancy, antepartum; Language barrier; and Hemorrhoids during pregnancy on their problem list.  Patient reports {sx:14538}.  Contractions: Not present. Vag. Bleeding: None.  Movement: Present. Denies leaking of fluid.   The following portions of the patient's history were reviewed and updated as appropriate: allergies, current medications, past family history, past medical history, past social history, past surgical history and problem list. Problem list updated.  Objective:   Vitals:   01/22/18 1626  BP: 104/65  Pulse: 79  Weight: 173 lb 9.6 oz (78.7 kg)    Fetal Status: Fetal Heart Rate (bpm): 142 Fundal Height: 37 cm Movement: Present  Presentation: Vertex  General:  Alert, oriented and cooperative. Patient is in no acute distress.  Skin: Skin is warm and dry. No rash noted.   Cardiovascular: Normal heart rate noted  Respiratory: Normal respiratory effort, no problems with respiration noted  Abdomen: Soft, gravid, appropriate for gestational age.  Pain/Pressure: Present     Pelvic: {Blank single:19197::"Cervical exam performed","Cervical exam deferred"} Dilation: 4 Effacement (%): 50 Station: -3  Extremities: Normal range of  motion.  Edema:  Trace  Mental Status: Normal mood and affect. Normal behavior. Normal judgment and thought content.   Assessment and Plan:  Pregnancy: G2P1001 at 2956w4d  1. Vaginal discharge *** - Cervicovaginal ancillary only  {Blank single:19197::"Term","Preterm"} labor symptoms and general obstetric precautions including but not limited to vaginal bleeding, contractions, leaking of fluid and fetal movement were reviewed in detail with the patient. Please refer to After Visit Summary for other counseling recommendations.  No follow-ups on file.  Future Appointments  Date Time Provider Department Center  01/28/2018  4:15 PM Armando ReichertHogan, Heather D, [facility]  02/05/2018 11:15 AM Pincus LargePhelps, Jazma Y, DO [facility]    Sharyon CableVeronica C Georgina Krist, CNM

## 2018-01-22 NOTE — Progress Notes (Signed)
[employer] interpreter Helmut MusterAlicia [phone]

## 2018-01-22 NOTE — Progress Notes (Signed)
   PRENATAL VISIT NOTE  Subjective:  Sheri Perez is a 27 y.o. G2P1001 at 2528w4d being seen today for ongoing prenatal care.  She is currently monitored for the following issues for this low-risk pregnancy and has Family history of diabetes mellitus in father; Supervision of other normal pregnancy, antepartum; Language barrier; and Hemorrhoids during pregnancy on their problem list.  Patient reports occasional contractions and vaginal discharge.  Contractions: Not present. Vag. Bleeding: None.  Movement: Present. Denies leaking of fluid.   The following portions of the patient's history were reviewed and updated as appropriate: allergies, current medications, past family history, past medical history, past social history, past surgical history and problem list. Problem list updated.  Objective:   Vitals:   01/22/18 1626  BP: 104/65  Pulse: 79  Weight: 173 lb 9.6 oz (78.7 kg)    Fetal Status: Fetal Heart Rate (bpm): 142 Fundal Height: 37 cm Movement: Present  Presentation: Vertex  General:  Alert, oriented and cooperative. Patient is in no acute distress.  Skin: Skin is warm and dry. No rash noted.   Cardiovascular: Normal heart rate noted  Respiratory: Normal respiratory effort, no problems with respiration noted  Abdomen: Soft, gravid, appropriate for gestational age.  Pain/Pressure: Present     Pelvic: Cervical exam performed Dilation: 4 Effacement (%): 50 Station: -3  Extremities: Normal range of motion.  Edema: Trace  Mental Status: Normal mood and affect. Normal behavior. Normal judgment and thought content.   Assessment and Plan:  Pregnancy: G2P1001 at 3828w4d  1. Supervision of other normal pregnancy, antepartum -Patient reports painful contractions that started today, she reports she has been having contractions on and off since last night but today has been more frequent, she rates pain 4/10.  -Patient sent upstairs to MAU for labor evaluation   2. Language  barrier -[employer] spanish interpreter used for evaluation   3. Vaginal discharge -She reports increased vaginal discharge since contractions began, reports discharge being white and thin.  - Cervicovaginal ancillary only  Term labor symptoms and general obstetric precautions including but not limited to vaginal bleeding, contractions, leaking of fluid and fetal movement were reviewed in detail with the patient. Patient walked upstairs to MAU for labor evaluation.  Please refer to After Visit Summary for other counseling recommendations.  No follow-ups on file.  Future Appointments  Date Time Provider Department Center  01/28/2018  4:15 PM Armando ReichertHogan, Heather D, [facility]  02/05/2018 11:15 AM Pincus LargePhelps, Jazma Y, DO [facility]    Sharyon CableVeronica C Alisson Rozell, CNM

## 2018-01-22 NOTE — MAU Provider Note (Signed)
Term labor check sent up from [facility]. 4/50/-3. No cervical change.  C/O tan, malodorous vaginal discharge x several days. [facility]. On exam today there is blood-tinged mucus C/W mucus plug. No odor or clumpy white discharge.    EFM: Baseline: 135 bpm, Variability: Good {> 6 bpm), Accelerations: Reactive and Decelerations: Absent Toco: irregular, every 3-5 minutes, mild  D/C home Labor precautions Follow-up Information    [facility].   Specialty:  Obstetrics and Gynecology Why:  as scheduled for prenatal appointment  Contact information: [address] [place] [phone]       [facility] Follow up.   Why:  as needed if symptoms worsen Contact information: [address] [id number] mc [address] [place] [phone]           Katrinka BlazingSmith, [place], [place] 01/22/2018 6:53 PM

## 2018-01-22 NOTE — Discharge Instructions (Signed)
Braxton Hicks Contractions °Contractions of the uterus can occur throughout pregnancy, but they are not always a sign that you are in labor. You may have practice contractions called Braxton Hicks contractions. These false labor contractions are sometimes confused with true labor. °What are Braxton Hicks contractions? °Braxton Hicks contractions are tightening movements that occur in the muscles of the uterus before labor. Unlike true labor contractions, these contractions do not result in opening (dilation) and thinning of the cervix. Toward the end of pregnancy (32-34 weeks), Braxton Hicks contractions can happen more often and may become stronger. These contractions are sometimes difficult to tell apart from true labor because they can be very uncomfortable. You should not feel embarrassed if you go to the hospital with false labor. °Sometimes, the only way to tell if you are in true labor is for your health care provider to look for changes in the cervix. The health care provider will do a physical exam and may monitor your contractions. If you are not in true labor, the exam should show that your cervix is not dilating and your water has not broken. °If there are other health problems associated with your pregnancy, it is completely safe for you to be sent home with false labor. You may continue to have Braxton Hicks contractions until you go into true labor. °How to tell the difference between true labor and false labor °True labor °· Contractions last 30-70 seconds. °· Contractions become very regular. °· Discomfort is usually felt in the top of the uterus, and it spreads to the lower abdomen and low back. °· Contractions do not go away with walking. °· Contractions usually become more intense and increase in frequency. °· The cervix dilates and gets thinner. °False labor °· Contractions are usually shorter and not as strong as true labor contractions. °· Contractions are usually irregular. °· Contractions  are often felt in the front of the lower abdomen and in the groin. °· Contractions may go away when you walk around or change positions while lying down. °· Contractions get weaker and are shorter-lasting as time goes on. °· The cervix usually does not dilate or become thin. °Follow these instructions at home: °· Take over-the-counter and prescription medicines only as told by your health care provider. °· Keep up with your usual exercises and follow other instructions from your health care provider. °· Eat and drink lightly if you think you are going into labor. °· If Braxton Hicks contractions are making you uncomfortable: °? Change your position from lying down or resting to walking, or change from walking to resting. °? Sit and rest in a tub of warm water. °? Drink enough fluid to keep your urine pale yellow. Dehydration may cause these contractions. °? Do slow and deep breathing several times an hour. °· Keep all follow-up prenatal visits as told by your health care provider. This is important. °Contact a health care provider if: °· You have a fever. °· You have continuous pain in your abdomen. °Get help right away if: °· Your contractions become stronger, more regular, and closer together. °· You have fluid leaking or gushing from your vagina. °· You pass blood-tinged mucus (bloody show). °· You have bleeding from your vagina. °· You have low back pain that you never had before. °· You feel your baby’s head pushing down and causing pelvic pressure. °· Your baby is not moving inside you as much as it used to. °Summary °· Contractions that occur before labor are called Braxton   Hicks contractions, false labor, or practice contractions. °· Braxton Hicks contractions are usually shorter, weaker, farther apart, and less regular than true labor contractions. True labor contractions usually become progressively stronger and regular and they become more frequent. °· Manage discomfort from Braxton Hicks contractions by  changing position, resting in a warm bath, drinking plenty of water, or practicing deep breathing. °This information is not intended to replace advice given to you by your health care provider. Make sure you discuss any questions you have with your health care provider. °Document Released: 02/15/2017 Document Revised: 02/15/2017 Document Reviewed: 02/15/2017 °Elsevier Interactive Patient Education © 2018 Elsevier Inc. ° °

## 2018-01-22 NOTE — MAU Note (Addendum)
Pt sent from the clinic to be evaluated for labor.

## 2018-01-22 NOTE — Progress Notes (Signed)
Duplicate note (deleted).

## 2018-01-23 LAB — CERVICOVAGINAL ANCILLARY ONLY
Bacterial vaginitis: NEGATIVE
Candida vaginitis: NEGATIVE

## 2018-01-28 ENCOUNTER — Encounter: Payer: Self-pay | Admitting: Advanced Practice Midwife

## 2018-01-28 ENCOUNTER — Ambulatory Visit (INDEPENDENT_AMBULATORY_CARE_PROVIDER_SITE_OTHER): Payer: Medicaid Other | Admitting: Advanced Practice Midwife

## 2018-01-28 VITALS — BP 121/75 | HR 78 | Wt 173.7 lb

## 2018-01-28 DIAGNOSIS — Z3480 Encounter for supervision of other normal pregnancy, unspecified trimester: Secondary | ICD-10-CM

## 2018-01-28 NOTE — Progress Notes (Signed)
Interpreter Albertina SenegalMarly Adams

## 2018-01-28 NOTE — Progress Notes (Signed)
   PRENATAL VISIT NOTE  Subjective:  Sheri Perez is a 27 y.o. G2P1001 at 5158w3d being seen today for ongoing prenatal care.  She is currently monitored for the following issues for this low-risk pregnancy and has Family history of diabetes mellitus in father; Supervision of other normal pregnancy, antepartum; Language barrier; and Hemorrhoids during pregnancy on their problem list.  Patient reports contractions since this morning .  Contractions: Irregular. Vag. Bleeding: Small.  Movement: Present. Denies leaking of fluid.   Patient is having some mucousy spotting and contractions since this morning. She reports that they have  been getting stronger throughout the day. She was 4cm at her last visit  Here.   The following portions of the patient's history were reviewed and updated as appropriate: allergies, current medications, past family history, past medical history, past social history, past surgical history and problem list. Problem list updated.  Objective:   Vitals:   01/28/18 1635  BP: 121/75  Pulse: 78  Weight: 173 lb 11.2 oz (78.8 kg)    Fetal Status: Fetal Heart Rate (bpm): 128 Fundal Height: 40 cm Movement: Present  Presentation: Vertex  General:  Alert, oriented and cooperative. Patient is in no acute distress.  Skin: Skin is warm and dry. No rash noted.   Cardiovascular: Normal heart rate noted  Respiratory: Normal respiratory effort, no problems with respiration noted  Abdomen: Soft, gravid, appropriate for gestational age.  Pain/Pressure: Present     Pelvic: Cervical exam performed Dilation: 4.5 Effacement (%): 70 Station: -2  Extremities: Normal range of motion.  Edema: Mild pitting, slight indentation  Mental Status: Normal mood and affect. Normal behavior. Normal judgment and thought content.   Assessment and Plan:  Pregnancy: G2P1001 at 4458w3d  1. Supervision of other normal pregnancy, antepartum - likely early/latent labor. Patient to return if  contractions worsen.   Term labor symptoms and general obstetric precautions including but not limited to vaginal bleeding, contractions, leaking of fluid and fetal movement were reviewed in detail with the patient. Please refer to After Visit Summary for other counseling recommendations.  Return in about 1 week (around 02/04/2018).  Future Appointments  Date Time Provider Department Center  02/05/2018 11:15 AM Pincus LargePhelps, Jazma Y, DO [facility]    Thressa ShellerHeather Nidal Rivet, CNM

## 2018-01-30 ENCOUNTER — Other Ambulatory Visit: Payer: Self-pay

## 2018-01-30 ENCOUNTER — Inpatient Hospital Stay (HOSPITAL_COMMUNITY): Payer: Medicaid Other | Admitting: Anesthesiology

## 2018-01-30 ENCOUNTER — Encounter (HOSPITAL_COMMUNITY): Payer: Self-pay

## 2018-01-30 ENCOUNTER — Inpatient Hospital Stay (HOSPITAL_COMMUNITY)
Admission: AD | Admit: 2018-01-30 | Discharge: 2018-02-01 | DRG: 806 | Disposition: A | Discharge status: Home / Self Care | Payer: Medicaid Other | Source: Ambulatory Visit | Attending: Obstetrics and Gynecology | Admitting: Obstetrics and Gynecology

## 2018-01-30 DIAGNOSIS — O4292 Full-term premature rupture of membranes, unspecified as to length of time between rupture and onset of labor: Principal | ICD-10-CM | POA: Diagnosis present

## 2018-01-30 DIAGNOSIS — O2243 Hemorrhoids in pregnancy, third trimester: Secondary | ICD-10-CM | POA: Diagnosis present

## 2018-01-30 DIAGNOSIS — Z3A38 38 weeks gestation of pregnancy: Secondary | ICD-10-CM

## 2018-01-30 DIAGNOSIS — O43123 Velamentous insertion of umbilical cord, third trimester: Secondary | ICD-10-CM | POA: Diagnosis present

## 2018-01-30 DIAGNOSIS — Z3480 Encounter for supervision of other normal pregnancy, unspecified trimester: Secondary | ICD-10-CM

## 2018-01-30 DIAGNOSIS — Z833 Family history of diabetes mellitus: Secondary | ICD-10-CM

## 2018-01-30 DIAGNOSIS — O4202 Full-term premature rupture of membranes, onset of labor within 24 hours of rupture: Secondary | ICD-10-CM | POA: Diagnosis not present

## 2018-01-30 LAB — POCT FERN TEST: POCT Fern Test: POSITIVE

## 2018-01-30 LAB — CBC
HCT: 36.4 % (ref 36.0–46.0)
HEMOGLOBIN: 12.5 g/dL (ref 12.0–15.0)
MCH: 32.1 pg (ref 26.0–34.0)
MCHC: 34.3 g/dL (ref 30.0–36.0)
MCV: 93.3 fL (ref 78.0–100.0)
PLATELETS: 135 10*3/uL — AB (ref 150–400)
RBC: 3.9 MIL/uL (ref 3.87–5.11)
RDW: 13.5 % (ref 11.5–15.5)
WBC: 8.9 10*3/uL (ref 4.0–10.5)

## 2018-01-30 LAB — TYPE AND SCREEN
ABO/RH(D): O POS
ANTIBODY SCREEN: NEGATIVE

## 2018-01-30 MED ORDER — LIDOCAINE HCL (PF) 1 % IJ SOLN
30.0000 mL | INTRAMUSCULAR | Status: DC | PRN
  Administered 2018-01-31: 30 mL via SUBCUTANEOUS
  Filled 2018-01-30: qty 30

## 2018-01-30 MED ORDER — OXYTOCIN 40 UNITS IN LACTATED RINGERS INFUSION - SIMPLE MED
2.5000 [IU]/h | INTRAVENOUS | Status: DC
  Filled 2018-01-30: qty 1000

## 2018-01-30 MED ORDER — LACTATED RINGERS IV SOLN
INTRAVENOUS | Status: DC
Start: 2018-01-30 — End: 2018-01-31
  Administered 2018-01-30: 23:00:00 via INTRAVENOUS

## 2018-01-30 MED ORDER — OXYTOCIN BOLUS FROM INFUSION: 500.0000 mL | Freq: Once | INTRAVENOUS | Status: DC

## 2018-01-30 MED ORDER — PHENYLEPHRINE 40 MCG/ML (10ML) SYRINGE FOR IV PUSH (FOR BLOOD PRESSURE SUPPORT): 80.0000 ug | PREFILLED_SYRINGE | INTRAVENOUS | Status: DC | PRN

## 2018-01-30 MED ORDER — LACTATED RINGERS IV SOLN: 500.0000 mL | INTRAVENOUS | Status: DC | PRN

## 2018-01-30 MED ORDER — EPHEDRINE 5 MG/ML INJ: 10.0000 mg | INTRAVENOUS | Status: DC | PRN

## 2018-01-30 MED ORDER — OXYCODONE-ACETAMINOPHEN 5-325 MG PO TABS: 2.0000 | ORAL_TABLET | ORAL | Status: DC | PRN

## 2018-01-30 MED ORDER — ONDANSETRON HCL 4 MG/2ML IJ SOLN: 4.0000 mg | Freq: Four times a day (QID) | INTRAMUSCULAR | Status: DC | PRN

## 2018-01-30 MED ORDER — OXYTOCIN 40 UNITS IN LACTATED RINGERS INFUSION - SIMPLE MED
1.0000 m[IU]/min | INTRAVENOUS | Status: DC
  Administered 2018-01-30: 2 m[IU]/min via INTRAVENOUS
  Filled 2018-01-30: qty 1000

## 2018-01-30 MED ORDER — DIPHENHYDRAMINE HCL 50 MG/ML IJ SOLN: 12.5000 mg | INTRAMUSCULAR | Status: DC | PRN

## 2018-01-30 MED ORDER — FENTANYL 2.5 MCG/ML BUPIVACAINE 1/10 % EPIDURAL INFUSION (WH - ANES)
14.0000 mL/h | INTRAMUSCULAR | Status: DC | PRN
  Administered 2018-01-30 – 2018-01-31 (×2): 14 mL/h via EPIDURAL
  Filled 2018-01-30 (×2): qty 100

## 2018-01-30 MED ORDER — LACTATED RINGERS IV SOLN
500.0000 mL | Freq: Once | INTRAVENOUS | Status: AC
  Administered 2018-01-30: 500 mL via INTRAVENOUS

## 2018-01-30 MED ORDER — ACETAMINOPHEN 325 MG PO TABS: 650.0000 mg | ORAL_TABLET | ORAL | Status: DC | PRN

## 2018-01-30 MED ORDER — PHENYLEPHRINE 40 MCG/ML (10ML) SYRINGE FOR IV PUSH (FOR BLOOD PRESSURE SUPPORT)
80.0000 ug | PREFILLED_SYRINGE | INTRAVENOUS | Status: DC | PRN
  Filled 2018-01-30: qty 10

## 2018-01-30 MED ORDER — LIDOCAINE HCL (PF) 1 % IJ SOLN
INTRAMUSCULAR | Status: DC | PRN
  Administered 2018-01-30: 13 mL via EPIDURAL

## 2018-01-30 MED ORDER — SOD CITRATE-CITRIC ACID 500-334 MG/5ML PO SOLN: 30.0000 mL | ORAL | Status: DC | PRN

## 2018-01-30 MED ORDER — TERBUTALINE SULFATE 1 MG/ML IJ SOLN: 0.2500 mg | Freq: Once | INTRAMUSCULAR | Status: DC | PRN

## 2018-01-30 MED ORDER — OXYCODONE-ACETAMINOPHEN 5-325 MG PO TABS: 1.0000 | ORAL_TABLET | ORAL | Status: DC | PRN

## 2018-01-30 NOTE — Anesthesia Preprocedure Evaluation (Signed)
Anesthesia Evaluation  Patient identified by MRN, date of birth, ID band Patient awake    Reviewed: Allergy & Precautions, NPO status , Patient's Chart, lab work & pertinent test results  Airway Mallampati: II  TM Distance: >3 FB Neck ROM: Full    Dental no notable dental hx.    Pulmonary neg pulmonary ROS,    Pulmonary exam normal breath sounds clear to auscultation       Cardiovascular negative cardio ROS Normal cardiovascular exam Rhythm:Regular Rate:Normal     Neuro/Psych negative neurological ROS  negative psych ROS   GI/Hepatic negative GI ROS, Neg liver ROS,   Endo/Other  negative endocrine ROS  Renal/GU negative Renal ROS  negative genitourinary   Musculoskeletal negative musculoskeletal ROS (+)   Abdominal   Peds negative pediatric ROS (+)  Hematology negative hematology ROS (+)   Anesthesia Other Findings   Reproductive/Obstetrics negative OB ROS (+) Pregnancy                             Anesthesia Physical Anesthesia Plan  ASA: II  Anesthesia Plan: Epidural   Post-op Pain Management:    Induction:   PONV Risk Score and Plan:   Airway Management Planned:   Additional Equipment:   Intra-op Plan:   Post-operative Plan:   Informed Consent:   Plan Discussed with:   Anesthesia Plan Comments:         Anesthesia Quick Evaluation  

## 2018-01-30 NOTE — MAU Note (Signed)
Pt states she began having UCs yesterday that intensified approx 1530 today.  States vag bleeding x 1 week, but appears to have gotten heavier since yesterday; not saturating a pad in hr.  States she feels she has been having a lot of vag d/c x 2 weeks & is unsure if SROM.

## 2018-01-30 NOTE — Anesthesia Procedure Notes (Signed)
Epidural Patient location during procedure: OB Start time: 01/30/2018 10:58 PM End time: 01/30/2018 11:12 PM  Staffing Anesthesiologist: Lowella CurbMiller, Ninel Abdella Ray, MD Performed: anesthesiologist   Preanesthetic Checklist Completed: patient identified, site marked, surgical consent, pre-op evaluation, timeout performed, IV checked, risks and benefits discussed and monitors and equipment checked  Epidural Patient position: sitting Prep: ChloraPrep Patient monitoring: heart rate, cardiac monitor, continuous pulse ox and blood pressure Approach: midline Location: L2-L3 Injection technique: LOR saline  Needle:  Needle type: Tuohy  Needle gauge: 17 G Needle length: 9 cm Needle insertion depth: 5 cm Catheter type: closed end flexible Catheter size: 20 Guage Catheter at skin depth: 9 cm Test dose: negative  Assessment Events: blood not aspirated, injection not painful, no injection resistance, negative IV test and no paresthesia  Additional Notes Reason for block:procedure for pain

## 2018-01-30 NOTE — H&P (Addendum)
OBSTETRIC ADMISSION HISTORY AND PHYSICAL  Sheri Perez is a 27 y.o. female G2P1001 with IUP at [redacted]w[redacted]d by U/S presenting for Latent Labor with PROM (+ Fern test) approximately Tuesday morning around 1 am. Patient presented to MAU today due to increased contractions. She reports +FMs, no VB, no blurry vision, headaches or peripheral edema, and RUQ pain.  She plans on Breast feeding and bottle feeding. She is not interested in birth control.   She received her prenatal care at [facility]. On prenatal visit on 01/28/2018 she was Dilated 4.5 with 70% Effacement at station -2 but denied any fluid leakage at that point.  Language barrier to patient care.(Spanish)  Dating: By U/S--->  Estimated Date of Delivery: 02/08/18  Sono:    @[redacted]w[redacted]d , CWD, normal anatomy, Cephalic presentation, 338g, 56% EFW  Prenatal History/Complications: Low-risk pregnancy  Past Medical History: Past Medical History:  Diagnosis Date  . Medical history non-contributory     Past Surgical History: Past Surgical History:  Procedure Laterality Date  . NO PAST SURGERIES      Obstetrical History: OB History    Gravida  2   Para  1   Term  1   Preterm  0   AB      Living  1     SAB      TAB      Ectopic      Multiple      Live Births  1           Social History: Social History   Socioeconomic History  . Marital status: Married    Spouse name: Not on file  . Number of children: Not on file  . Years of education: Not on file  . Highest education level: Not on file  Occupational History  . Not on file  Social Needs  . Financial resource strain: Not on file  . Food insecurity:    Worry: Not on file    Inability: Not on file  . Transportation needs:    Medical: Not on file    Non-medical: Not on file  Tobacco Use  . Smoking status: Never Smoker  . Smokeless tobacco: Never Used  Substance and Sexual Activity  . Alcohol use: No    Alcohol/week: 0.0 oz  . Drug use: No  .  Sexual activity: Yes    Birth control/protection: None  Lifestyle  . Physical activity:    Days per week: Not on file    Minutes per session: Not on file  . Stress: Not on file  Relationships  . Social connections:    Talks on phone: Not on file    Gets together: Not on file    Attends religious service: Not on file    Active member of club or organization: Not on file    Attends meetings of clubs or organizations: Not on file    Relationship status: Not on file  Other Topics Concern  . Not on file  Social History Narrative  . Not on file    Family History: Family History  Problem Relation Age of Onset  . Diabetes Father   . Hypertension Father     Allergies: No Known Allergies  Medications Prior to Admission  Medication Sig Dispense Refill Last Dose  . calcium carbonate (TUMS - DOSED IN MG ELEMENTAL CALCIUM) 500 MG chewable tablet Chew 1 tablet by mouth as needed for indigestion or heartburn.   Taking  . hydrocortisone (ANUSOL-HC) 2.5 % rectal cream Apply  rectally 2 times daily 30 g 1 Taking  . Prenatal Vit-Fe Fumarate-FA (PRENATAL MULTIVITAMIN) TABS tablet Take 1 tablet by mouth daily at 12 noon.   Taking     Review of Systems   All systems reviewed and negative except as stated in HPI  Height 5\' 2"  (1.575 m), weight 79.4 kg (175 lb), last menstrual period 05/04/2017. General appearance: alert and cooperative Lungs: clear to auscultation bilaterally Heart: regular rate and rhythm Abdomen: soft, non-tender; bowel sounds normal Extremities: Homans sign is negative, no sign of DVT DTR's normal  Presentation: cephalic Fetal monitoringBaseline: 135 bpm, Variability: Good {> 6 bpm), Accelerations: Reactive and Decelerations: Absent Uterine activityFrequency: Every 4 minutes and Duration: 60 seconds Pelvic:  Dilation: 4 Effacement (%): 70 Station: -1 Exam by:: jaton burgess rnc  Prenatal labs: ABO, Rh: O/Positive/-- (10/08 1047) Antibody: Negative (10/08  1047) Rubella: 3.88 (10/08 1047) RPR: Non Reactive (02/08 1357)  HBsAg: Negative (10/08 1047)  HIV: Non Reactive (02/08 1357)  GBS: Negative (03/29 1141)  2 hr Glucola Normal  Genetic screening  Negative  Anatomy US Normal  Prenatal Transfer Tool  Maternal Diabetes: No Genetic Screening: Normal Maternal Ultrasounds/Referrals: Normal Fetal Ultrasounds or other Referrals:  None Maternal Substance Abuse:  No Significant Maternal Medications:  None Significant Maternal Lab Results: None  Results for orders placed or performed during the hospital encounter of 01/30/18 (from the past 24 hour(s))  POCT fern test   Collection Time: 01/30/18  5:25 PM  Result Value Ref Range   POCT Fern Test Positive = ruptured amniotic membanes     Patient Active Problem List   Diagnosis Date Noted  . Hemorrhoids during pregnancy 12/18/2017  . Language barrier 10/15/2017  . Supervision of other normal pregnancy, antepartum 07/30/2017  . Family history of diabetes mellitus in father 07/23/2017    Assessment/Plan:  Sheri Perez is a 27 y.o. G2P1001 at [redacted]w[redacted]d here for latent labor with PROM (+Fern test).   #Labor: Early labor. Will augment with Pitocin since not making change and ROM >24hrs. #Pain: Epidural upon request  #FWB: Category 1 #ID: GBS Negative  #MOF: Breast  #MOC:Not interested  #Circ:  No  Beverly SessionsGrant G Davidson, Student-PA  01/30/2018, 5:53 PM  Caryl AdaJazma Worley Radermacher, DO OB Fellow Center [facility], [facility]

## 2018-01-31 ENCOUNTER — Encounter (HOSPITAL_COMMUNITY): Payer: Self-pay | Admitting: *Deleted

## 2018-01-31 DIAGNOSIS — O4202 Full-term premature rupture of membranes, onset of labor within 24 hours of rupture: Secondary | ICD-10-CM

## 2018-01-31 DIAGNOSIS — Z3A38 38 weeks gestation of pregnancy: Secondary | ICD-10-CM

## 2018-01-31 LAB — RPR: RPR: NONREACTIVE

## 2018-01-31 MED ORDER — ONDANSETRON HCL 4 MG/2ML IJ SOLN: 4.0000 mg | INTRAMUSCULAR | Status: DC | PRN

## 2018-01-31 MED ORDER — SIMETHICONE 80 MG PO CHEW
80.0000 mg | CHEWABLE_TABLET | ORAL | Status: DC | PRN
Start: 2018-01-31 — End: 2018-02-01

## 2018-01-31 MED ORDER — PRENATAL MULTIVITAMIN CH
1.0000 | ORAL_TABLET | Freq: Every day | ORAL | Status: DC
  Administered 2018-01-31 – 2018-02-01 (×2): 1 via ORAL
  Filled 2018-01-31 (×2): qty 1

## 2018-01-31 MED ORDER — SENNOSIDES-DOCUSATE SODIUM 8.6-50 MG PO TABS
2.0000 | ORAL_TABLET | ORAL | Status: DC
  Administered 2018-01-31: 2 via ORAL
  Filled 2018-01-31: qty 2

## 2018-01-31 MED ORDER — MISOPROSTOL 200 MCG PO TABS
600.0000 ug | ORAL_TABLET | Freq: Once | ORAL | Status: AC
  Administered 2018-01-31: 600 ug via VAGINAL

## 2018-01-31 MED ORDER — ZOLPIDEM TARTRATE 5 MG PO TABS: 5.0000 mg | ORAL_TABLET | Freq: Every evening | ORAL | Status: DC | PRN

## 2018-01-31 MED ORDER — IBUPROFEN 600 MG PO TABS
600.0000 mg | ORAL_TABLET | Freq: Four times a day (QID) | ORAL | Status: DC
  Administered 2018-01-31 – 2018-02-01 (×5): 600 mg via ORAL
  Filled 2018-01-31 (×5): qty 1

## 2018-01-31 MED ORDER — WITCH HAZEL-GLYCERIN EX PADS: 1.0000 "application " | MEDICATED_PAD | CUTANEOUS | Status: DC | PRN

## 2018-01-31 MED ORDER — DIPHENHYDRAMINE HCL 25 MG PO CAPS: 25.0000 mg | ORAL_CAPSULE | Freq: Four times a day (QID) | ORAL | Status: DC | PRN

## 2018-01-31 MED ORDER — BENZOCAINE-MENTHOL 20-0.5 % EX AERO
1.0000 "application " | INHALATION_SPRAY | CUTANEOUS | Status: DC | PRN
  Filled 2018-01-31: qty 56

## 2018-01-31 MED ORDER — MISOPROSTOL 200 MCG PO TABS
ORAL_TABLET | ORAL | Status: AC
  Filled 2018-01-31: qty 3

## 2018-01-31 MED ORDER — ONDANSETRON HCL 4 MG PO TABS: 4.0000 mg | ORAL_TABLET | ORAL | Status: DC | PRN

## 2018-01-31 MED ORDER — DIBUCAINE 1 % RE OINT: 1.0000 "application " | TOPICAL_OINTMENT | RECTAL | Status: DC | PRN

## 2018-01-31 MED ORDER — COCONUT OIL OIL: 1.0000 "application " | TOPICAL_OIL | Status: DC | PRN

## 2018-01-31 MED ORDER — TETANUS-DIPHTH-ACELL PERTUSSIS 5-2.5-18.5 LF-MCG/0.5 IM SUSP: 0.5000 mL | Freq: Once | INTRAMUSCULAR | Status: DC

## 2018-01-31 MED ORDER — ACETAMINOPHEN 325 MG PO TABS: 650.0000 mg | ORAL_TABLET | ORAL | Status: DC | PRN

## 2018-01-31 NOTE — Anesthesia Postprocedure Evaluation (Signed)
Anesthesia Post Note  Patient: Sheri Perez  Procedure(s) Performed: AN AD HOC LABOR EPIDURAL     Patient location during evaluation: Mother Baby Anesthesia Type: Epidural Level of consciousness: awake and alert Pain management: pain level controlled Vital Signs Assessment: vitals unstable Respiratory status: spontaneous breathing Cardiovascular status: stable Postop Assessment: no headache, patient able to bend at knees, no backache, no apparent nausea or vomiting, epidural receding and adequate PO intake Anesthetic complications: no    Last Vitals:  Vitals:   01/31/18 1015 01/31/18 1115  BP: 112/79 116/76  Pulse: 77 74  Resp: 18 18  Temp: 37.6 C 37.5 C  SpO2:      Last Pain:  Vitals:   01/31/18 1826  TempSrc:   PainSc: 0-No pain   Pain Goal:                 Salome ArntSterling, Lurdes Haltiwanger Marie

## 2018-01-31 NOTE — Progress Notes (Signed)
Labor Progress Note  Sheri Perez is a 27 y.o. G2P1001 at 6743w6d  admitted for rupture of membranes  S: Doing well now with epidural. Denies any concerns.Resting.   O:  BP (!) 105/55   Pulse 75   Temp 98.5 F (36.9 C) (Oral)   Resp 18   Ht 5\' 2"  (1.575 m)   Wt 79.4 kg (175 lb)   LMP 05/04/2017   SpO2 100%   BMI 32.01 kg/m   No intake/output data recorded.  FHT:  FHR: 140 bpm, variability: moderate,  accelerations:  Present,  decelerations:  Absent UC:   regular, every 2 minutes SVE:   Dilation: 6.5 Effacement (%): 80, 90 Station: -2, -1 Exam by:: Gearldine Bienenstockiana Castillo, RN [facility]OM  Pitocin @ 6 mu/min  Labs: Lab Results  Component Value Date   WBC 8.9 01/30/2018   HGB 12.5 01/30/2018   HCT 36.4 01/30/2018   MCV 93.3 01/30/2018   PLT 135 (L) 01/30/2018    Assessment / Plan: 27 y.o. G2P1001 4143w6d in active labor Augmentation of labor, progressing well  Labor: Continue to titrate Pitocin for adequate cotnractions Fetal Wellbeing:  Category I Pain Control:  Epidural Anticipated MOD:  NSVD  Expectant management   Caryl AdaJazma Rylan Kaufmann, DO OB Fellow Center [facility], [facility]

## 2018-01-31 NOTE — Progress Notes (Signed)
Check on pt needs, ordered meals, by Viria Alvarez Spanish Interpreter. °

## 2018-01-31 NOTE — Progress Notes (Signed)
Labor Progress Note  Sheri Perez is a 27 y.o. G2P1001 at [redacted]w[redacted]d  admitted for rupture of membranes  S: Doing well now with epidural. Denies any concerns.  O:  BP 121/69   Pulse 72   Temp 98.2 F (36.8 C) (Oral)   Resp 18   Ht 5\' 2"  (1.575 m)   Wt 79.4 kg (175 lb)   LMP 05/04/2017   SpO2 100%   BMI 32.01 kg/m   No intake/output data recorded.  FHT:  FHR: 135 bpm, variability: moderate,  accelerations:  Present,  decelerations:  Absent UC:   regular, every 2-5 minutes SVE:   Dilation: 5.5 Effacement (%): 70 Station: -2 Exam by:: Dr. Doroteo GlassmanPhelps. SROM  Pitocin @ 4 mu/min  Labs: Lab Results  Component Value Date   WBC 8.9 01/30/2018   HGB 12.5 01/30/2018   HCT 36.4 01/30/2018   MCV 93.3 01/30/2018   PLT 135 (L) 01/30/2018    Assessment / Plan: 27 y.o. G2P1001 [redacted]w[redacted]d in early labor Augmentation of labor, progressing well  Labor: Continue to titrate Pitocin for adequate cotnractions Fetal Wellbeing:  Category I Pain Control:  Epidural Anticipated MOD:  NSVD  Expectant management   Caryl AdaJazma Phelps, DO OB Fellow Center for [facility], [facility]

## 2018-02-01 MED ORDER — IBUPROFEN 600 MG PO TABS: 600.0000 mg | ORAL_TABLET | Freq: Four times a day (QID) | ORAL | 0 refills | Status: DC

## 2018-02-01 NOTE — Discharge Instructions (Signed)
Parto vaginal, cuidados posteriores  Vaginal Delivery, Care After  Siga estas instrucciones durante las prximas semanas. Estas indicaciones le proporcionan informacin acerca de cmo deber cuidarse despus del parto vaginal. Su mdico tambin podr darle indicaciones ms especficas. El tratamiento ha sido planificado segn las prcticas mdicas actuales, pero en algunos casos pueden ocurrir problemas. Llame al mdico si tiene problemas o preguntas.  Qu puedo esperar despus del procedimiento?  Despus de un parto vaginal, es frecuente tener lo siguiente:   Hemorragia leve de la vagina.   Dolor en el abdomen, la vagina y la zona de la piel entre la abertura vaginal y el ano (perineo).   Calambres plvicos.   Fatiga.    Siga estas indicaciones en su casa:  Medicamentos   Tome los medicamentos de venta libre y los recetados solamente como se lo haya indicado el mdico.   Si le recetaron un antibitico, tmelo como se lo haya indicado el mdico. No interrumpa la administracin del antibitico hasta que lo haya terminado.  Conducir     No conduzca ni opere maquinaria pesada mientras toma analgsicos recetados.   No conduzca durante 24horas si le administraron un sedante.  Estilo de vida   No beba alcohol. Esto es de suma importancia si est amamantando o toma analgsicos.   No consuma productos que contengan tabaco, incluidos cigarrillos, tabaco de mascar o cigarrillos electrnicos. Si necesita ayuda para dejar de fumar, consulte al mdico.  Qu debe comer y beber   Beba al menos 8vasos de ochoonzas (240cc) de agua todos los das a menos que el mdico le indique lo contrario. Si elige amamantar al beb, quiz deba beber an ms cantidad de agua.   Coma alimentos ricos en fibras todos los das. Estos alimentos pueden ayudarla a prevenir o aliviar el estreimiento. Los alimentos ricos en fibras incluyen, entre otros:  ? Panes y cereales integrales.  ? Arroz integral.  ? Frijoles.  ? Frutas y verduras  frescas.  Actividad   Retome sus actividades normales como se lo haya indicado el mdico. Pregntele al mdico qu actividades son seguras para usted.   Descanse todo lo que pueda. Trate de descansar o tomar una siesta mientras el beb est durmiendo.   No levante objetos que pesen ms que su beb o 10libras (4,5kg) hasta que el mdico le diga que es seguro.   Hable con el mdico sobre cundo puede retomar la actividad sexual. Esto puede depender de lo siguiente:  ? Riesgo de sufrir una infeccin.  ? Velocidad de cicatrizacin.  ? Comodidad y deseo de retomar la actividad sexual.  Cuidados vaginales   Si le realizaron una episiotoma o tuvo un desgarro vaginal, contrlese la zona todos los das para detectar signos de infeccin. Est atenta a los siguientes signos:  ? Aumento del enrojecimiento, la hinchazn o el dolor.  ? Mayor presencia de lquido o sangre.  ? Calor.  ? Pus o mal olor.   No use tampones ni se haga duchas vaginales hasta que el mdico la autorice.   Controle la sangre que elimina por la vagina para detectar cogulos de sangre. Estos pueden tener el aspecto de grumos de color rojo oscuro, o secrecin marrn o negra.  Instrucciones generales   Mantenga el perineo limpio y seco, como se lo haya indicado el mdico.   Use ropa cmoda y suelta.   Cuando vaya al bao, siempre higiencese de adelante hacia atrs.   Pregntele al mdico si puede ducharse o tomar baos de inmersin.   Si se le realiz una episiotoma o tuvo un desgarro perineal durante el trabajo del parto o el parto, es posible que el mdico le indique que no tome baos de inmersin durante un determinado tiempo.   Use un sostn que sujete y ajuste bien sus pechos.   Si es posible, pdale a alguien que la ayude con las tareas del hogar y a cuidar del beb durante al menos algunos das despus de que le den el alta del hospital.   Concurra a todas las visitas de seguimiento para usted y el beb, como se lo haya indicado el  mdico. Esto es importante.  Comunquese con un mdico si:   Tiene los siguientes sntomas:  ? Secrecin vaginal que tiene mal olor.  ? Dificultad para orinar.  ? Dolor al orinar.  ? Aumento o disminucin repentinos de la frecuencia de las deposiciones.  ? Ms enrojecimiento, hinchazn o dolor alrededor de la episiotoma o del desgarro vaginal.  ? Ms secrecin de lquido o sangre de la episiotoma o del desgarro vaginal.  ? Pus o mal olor proveniente de la episiotoma o del desgarro vaginal.  ? Fiebre.  ? Erupcin cutnea.  ? Poco inters o falta de inters en actividades que solan gustarle.  ? Dudas sobre su cuidado y el del beb.   Siente la episiotoma o el desgarro vaginal caliente al tacto.   La episiotoma o el desgarro vaginal se abren o no parecen cicatrizar.   Siente dolor en las mamas, o estn duras o enrojecidas.   Siente tristeza o preocupacin de forma inusual.   Siente nuseas o vomita.   Elimina cogulos de sangre grandes por la vagina. Si expulsa un cogulo de sangre por la vagina, gurdelo para mostrrselo a su mdico. No tire la cadena sin que el mdico examine el cogulo de sangre antes.   Orina ms de lo habitual.   Se siente mareada o se desmaya.   No ha amamantado para nada y no ha tenido un perodo menstrual durante 12 semanas despus del parto.   Dej de amamantar al beb y no ha tenido su perodo menstrual durante 12 semanas despus de dejar de amamantar.  Solicite ayuda de inmediato si:   Tiene los siguientes sntomas:  ? Dolor que no desaparece o no mejora con medicamentos.  ? Dolor en el pecho.  ? Dificultad para respirar.  ? Visin borrosa o manchas en la vista.  ? Pensamientos de autolesionarse o lesionar al beb.   Comienza a sentir dolor en el abdomen o en una de las piernas.   Presenta un dolor de cabeza intenso.   Se desmaya.   Tiene una hemorragia de la vagina tan intensa que empapa dos toallitas sanitarias en una hora.  Esta informacin no tiene como fin  reemplazar el consejo del mdico. Asegrese de hacerle al mdico cualquier pregunta que tenga.  Document Released: 10/02/2005 Document Revised: 01/24/2017 Document Reviewed: 10/17/2015  Elsevier Interactive Patient Education  2018 Elsevier Inc.

## 2018-02-01 NOTE — Lactation Note (Signed)
This note was copied from a baby's chart. Lactation Consultation Note Spanish [place] interpreter used # [id number] Vicente SereneGabriel. Baby is 8319 hrs old wanting to BF more often. Mom stated baby BF for 10 minutes and she thought baby had enough because he stopped. But now he's crying again and its only been 20 minutes. Discussed newborn feeding habits, behavior, STS, I&O, cluster feeding, supply and demand.  Mom has everted nipples. Baby needed stimulated to open mouth wider. Taught touch nose then top lip to prompt baby to open wide. Mom kept pulling breast back for baby to breathe. Latched in football position, tilted chin into breast w/nose out so mom can see baby can breathe. Baby had shallow "fish" lips. Taught release. Everted nipple smashed flat. Discussed that is what the nipple is suppose to look like. Again obtained deep latch.  Baby would popped off and on then settled down and BF well.  Encouraged to assess breast for transfer.  Mom stated baby is breast/formula. Will give formula only if baby doesn't seem satisfied.  Encouraged to BF first, massage breast to aide in baby getting more.  Mom encouraged to feed baby 8-12 times/24 hours and with feeding cues.  If baby hasn't cued in 3 hrs to feed, stimulate and try to feed baby.  Encouraged to call for assistance or questions.  [facility]/LC brochure given w/resources, support groups and LC services.   Patient Name: Sheri Perez [id number]'s Date: 02/01/2018 Reason for consult: Initial assessment   Maternal Data Has patient been taught Hand Expression?: Yes Does the patient have breastfeeding experience prior to this delivery?: No  Feeding Feeding Type: Breast Fed Length of feed: 20 min  LATCH Score Latch: Repeated attempts needed to sustain latch, nipple held in mouth throughout feeding, stimulation needed to elicit sucking reflex.  Audible Swallowing: Spontaneous and intermittent  Type of Nipple: Everted at rest and after  stimulation  Comfort (Breast/Nipple): Soft / non-tender  Hold (Positioning): Assistance needed to correctly position infant at breast and maintain latch.  LATCH Score: 8  Interventions Interventions: Breast feeding basics reviewed;Support pillows;Assisted with latch;Position options;Breast massage;Coconut oil;Hand express;Breast compression;Adjust position  Lactation Tools Discussed/Used WIC Program: Yes   Consult Status Consult Status: Follow-up Date: 02/02/18 Follow-up type: In-patient    [name], Diamond NickelLAURA G 02/01/2018, 3:06 AM

## 2018-02-01 NOTE — Discharge Summary (Addendum)
OB Discharge Summary     Patient Name: Sheri Perez DOB: 08/04/1991 MRN: [id number]  Date of admission: 01/30/2018 Delivering MD: Pincus LargePHELPS, Zachariah Pavek Y   Date of discharge: 02/01/2018  Admitting diagnosis: 38WKS CTX, BLEEDING, PRESSURE Intrauterine pregnancy: 5165w6d     Secondary diagnosis:  Active Problems:   SVD (spontaneous vaginal delivery)  Additional problems: none     Discharge diagnosis: Term Pregnancy Delivered                                                                                                Post partum procedures:None  Augmentation: Pitocin    Complications: None  Hospital course:  Onset of Labor With Vaginal Delivery     27 y.o. yo [id number] at 8965w6d was admitted in Latent Labor on 01/30/2018. Patient had an uncomplicated labor course as follows:  Membrane Rupture Time/Date: 1:30 AM ,01/29/2018   Intrapartum Procedures: Episiotomy: Median [2]                                         Lacerations:  2nd degree [3]  Patient had a delivery of a Viable infant. 01/31/2018  Information for the patient's newborn:  Sheri Perez, [name]  Delivery Method: Vaginal, Spontaneous(Filed from Delivery Summary)    Pateint had an uncomplicated postpartum course.  She is ambulating, tolerating a regular diet, passing flatus, and urinating well. Patient is discharged home in stable condition on 02/01/18.   Physical exam  Vitals:   01/31/18 1015 01/31/18 1115 01/31/18 2100 02/01/18 0449  BP: 112/79 116/76 110/70 117/80  Pulse: 77 74 81 88  Resp: 18 18 16 18   Temp: 99.7 F (37.6 C) 99.5 F (37.5 C) 98.5 F (36.9 C) 98 F (36.7 C)  TempSrc: Oral Oral Oral Oral  SpO2:    99%  Weight:      Height:       General: alert, cooperative and no distress Lochia: appropriate Uterine Fundus: firm Incision: N/A DVT Evaluation: No evidence of DVT seen on physical exam. Negative Homan's sign. Labs: Lab Results  Component Value Date   WBC 8.9  01/30/2018   HGB 12.5 01/30/2018   HCT 36.4 01/30/2018   MCV 93.3 01/30/2018   PLT 135 (L) 01/30/2018   CMP Latest Ref Rng & Units 09/01/2015  Glucose 65 - 99 mg/dL 94  BUN 6 - 20 mg/dL 15  Creatinine 1.300.44 - 8.651.00 mg/dL 7.840.50  Sodium 696135 - 295145 mmol/L 141  Potassium 3.5 - 5.1 mmol/L 3.6  Chloride 101 - 111 mmol/L 104    Discharge instruction: per After Visit Summary and "Baby and Me Booklet".  After visit meds:  Allergies as of 02/01/2018   No Known Allergies     Medication List    STOP taking these medications   calcium carbonate 500 MG chewable tablet Commonly known as:  TUMS - dosed in mg elemental calcium   hydrocortisone 2.5 % rectal cream Commonly known as:  ANUSOL-HC   prenatal multivitamin Tabs tablet  TAKE these medications   ibuprofen 600 MG tablet Commonly known as:  ADVIL,MOTRIN Take 1 tablet (600 mg total) by mouth every 6 (six) hours.       Diet: routine diet  Activity: Advance as tolerated. Pelvic rest for 6 weeks.   Outpatient follow up:6 weeks Follow up Appt: Future Appointments  Date Time Provider Department Center  02/05/2018 11:15 AM Pincus Large, DO [facility]   Follow up Visit:No follow-ups on file.  Postpartum contraception: None; discussed with patient whom declined   Newborn Data: Live born female  Birth Weight: 8 lb 15.4 oz (4065 g) APGAR: 8, 9  Newborn Delivery   Birth date/time:  01/31/2018 07:30:00 Delivery type:  Vaginal, Spontaneous     Baby Feeding: Breast Disposition:home with mother   02/01/2018 [name], MD  OB FELLOW DISCHARGE ATTESTATION  I have seen and examined this patient. I agree with above documentation and have made edits as needed.   Caryl Ada, DO OB Fellow 10:04 AM

## 2018-02-02 ENCOUNTER — Ambulatory Visit: Payer: Self-pay

## 2018-02-02 NOTE — Lactation Note (Signed)
This note was copied from a baby's chart. Lactation Consultation Note  Arrived to see mother for discharge instructions. Mother breastfeeding infant in side lying position. Mother advised to continue to breastfeed infant 8-12 times in 24 hours and continue to do frequent skin to skin. Mother reports that infant is feeding well. Mother has hand pump at the bedside advised to continue to post pump and if supplement use her own milk. Discussed treatment to prevent severe engorgement. Advised mother [facility] or WIC as needed.   Patient Name: Boy Earl Manyeresita Moncada Aguilar ZOXWR'UToday's Date: 02/02/2018 Reason for consult: Follow-up assessment   Maternal Data    Feeding Feeding Type: Breast Fed  LATCH Score Latch: Grasps breast easily, tongue down, lips flanged, rhythmical sucking.  Audible Swallowing: Spontaneous and intermittent  Type of Nipple: Everted at rest and after stimulation  Comfort (Breast/Nipple): Filling, red/small blisters or bruises, mild/mod discomfort  Hold (Positioning): No assistance needed to correctly position infant at breast.  LATCH Score: 9  Interventions    Lactation Tools Discussed/Used     Consult Status Consult Status: Complete    Michel BickersKendrick, Tyiesha Brackney McCoy 02/02/2018, 11:40 AM

## 2018-02-05 ENCOUNTER — Encounter: Payer: Self-pay | Admitting: Obstetrics and Gynecology

## 2018-03-08 ENCOUNTER — Other Ambulatory Visit: Payer: Self-pay

## 2018-03-08 ENCOUNTER — Ambulatory Visit (INDEPENDENT_AMBULATORY_CARE_PROVIDER_SITE_OTHER): Payer: Medicaid Other | Admitting: Obstetrics and Gynecology

## 2018-03-08 ENCOUNTER — Encounter: Payer: Self-pay | Admitting: Obstetrics and Gynecology

## 2018-03-08 DIAGNOSIS — Z1389 Encounter for screening for other disorder: Secondary | ICD-10-CM | POA: Diagnosis not present

## 2018-03-08 NOTE — Patient Instructions (Signed)
Congestin mamaria (Breast Engorgement) La congestin mamaria se produce cuando las mamas se llenan excesivamente con [employer]. Durante las primeras semanas despus de dar a luz, algunas mujeres pueden experimentar [employer]. A pesar de que es normal que sienta las [address], [address] e incmodas durante 3 a 5 [address]  [address] despus del parto, la congestin mamaria puede provocar dolor punzante, dureza, tensin excesiva, calor y sensibilidad en las mamas. El pico de la congestin mamaria ocurre aproximadamente al quinto da despus del [place]. Esta afeccin puede tratarse fcilmente y no es necesario que deje de Museum/gallery exhibitions officer. CAUSAS DE LA CONGESTIN MAMARIA Algunas mujeres retrasan los horarios de la alimentacin porque tienen los pezones doloridos y Chief Strategy Officer, lo que puede causar una congestin. Este problema a menudo se origina porque el beb no se prende adecuadamente (cuando la boca del beb se sujeta a la mama para alimentarse). Si el beb se prende correctamente, debera poder alimentarse el tiempo que sea necesario, sin causar Scientist, research (medical). Si siente dolor al [employer], retire al beb de la mama e intntelo nuevamente. Solicite ayuda a su mdico o a Press photographer, si contina con dolor. Otras causas de congestin mamaria incluyen:  Posicin inadecuada del beb al [employer].  Dejar que pase mucho tiempo entre cada alimentacin.  Reduccin de la lactancia materna porque se le da al beb agua, jugo, leche maternizada, [place] materna en un bibern o un chupete, en lugar de amamantarlo.  Cambios en los patrones de alimentacin del beb.  Succin dbil del beb, que hace que se extraiga menos leche de la mama en cada alimentacin.  Fatiga, estrs, anemia.  Obstruccin de los [employer].  Antecedente de ciruga de mama. SIGNOS Y SNTOMAS DE CONGESTIN MAMARIA Si las mamas se congestionan, puede experimentar:  Hinchazn, sensibilidad, calor, enrojecimiento o dolor punzante  en las mamas.  Dureza en las mamas y tensin de la piel a su alrededor.  Achatamiento, tensin y dureza de los pezones.  Fiebre baja, que puede confundirse con una infeccin [place]. RECOMENDACIONES PARA ALIVIAR LA CONGESTIN MAMARIA La congestin mamaria debera mejorar en 24 a 48 horas, despus de seguir estas recomendaciones:  Alimente al beb cuando muestre signos de hambre o si siente la necesidad de reducir la congestin de las [place]. Esto se denomina "lactancia a demanda".  Los recin nacidos (bebs de menos de [place]) a menudo se alimentan cada 1 a 3 horas Administrator. Es posible que deba despertar al beb para alimentarlo si est dormido en el horario de la alimentacin.  No permita que el beb duerma ms de 5 horas durante la noche sin alimentarse.  Extraiga [employer] o con un sacaleche antes de Museum/gallery exhibitions officer, para ablandar la mama, la areola y el pezn.  Aplique calor o calor hmedo (en la ducha o con toallas de mano empapadas con agua tibia) antes de amamantar o extraer [place], o bien masajee las mamas antes o durante la Market researcher. Esto aumenta la circulacin y [place] a que la [place].  Vace por completo las mamas al [employer] o extraer [place]. Luego use un sostn ajustado (para Museum/gallery exhibitions officer o comn) o camiseta sin mangas durante 1 o 2 das, para Horticulturist, commercial al cuerpo que disminuya ligeramente la produccin de [place]. Solo use sostenes ajustados o camisetas sin mangas para tratar la [employer]. Por lo general, las madres que [employer] deben [employer] sostenes ajustados. Una vez que se alivie la congestin [place], vuelva a usar la ropa holgada habitual.  Aplique compresas de hielo a  las mamas para Engineer, materials de la congestin y Museum/gallery curator, a menos que el hielo le resulte molesto.  No retrase los horarios de Psychologist, sport and exercise. Trate de relajarse cuando sea el momento de alimentar al beb. Esto ayuda a Scientist, research (life sciences) "reflejo de eyeccin de la [place]", que  [employer] de la [place].  Asegrese de que el beb se prenda a la mama y se encuentre en la posicin correcta mientras lo [place].  Permita que el beb permanezca en el pecho mientras est bien prendido y succione activamente. El beb le har saber cuando haya terminado de alimentarse, al retirarse de la mama o quedarse dormido.  Evite dar biberones o chupetes al beb en las primeras semanas de la lactancia. Espere a que haya resuelto todos los desafos que Research officer, political party.  Trate de extraer [employer] mismo horario en que alimentara a su beb, si ha regresado a su trabajo o est fuera de su casa por un perodo prolongado.  Beba mucho lquido para evitar la deshidratacin, que, finalmente, puede implicar un mayor riesgo de sufrir congestin [place]. COMUNQUESE CON SU MDICO O ASESOR EN LACTANCIA SI:  La congestin mamaria dura ms de 2das, aun despus del tratamiento.  Tiene sntomas similares a los de la gripe, como fiebre, escalofros o dolor en el cuerpo.  Aumenta el enrojecimiento y Chief Technology Officer en las [place].  Esta informacin no tiene Theme park manager el consejo del mdico. Asegrese de hacerle al mdico cualquier pregunta que tenga. Document Released: 03/20/2008 Document Revised: 10/23/2014 Document Reviewed: 03/27/2013 Elsevier Interactive Patient Education  2017 [employer].

## 2018-03-08 NOTE — Progress Notes (Signed)
Post Partum Exam  Sheri Perez is a 27 y.o. [id number] female who presents for a postpartum visit. She is 6 week postpartum following a spontaneous vaginal delivery. I have fully reviewed the prenatal and intrapartum course. The delivery was at 39 gestational weeks.  Anesthesia: epidural. Postpartum course has been ok with some back pain at epidural site, vaginal area is sensitive from stiches. Baby's course has been unremarkable. Baby is feeding by breast. Bleeding pink. Bowel function is normal. Bladder function is normal. Patient is not sexually active. Contraception method is none. Postpartum depression screening:neg  The following portions of the patient's history were reviewed and updated as appropriate: allergies, current medications, past family history, past medical history, past social history, past surgical history and problem list. Last pap smear done 08/02/2017 and was Normal  Review of Systems Pertinent items are noted in HPI.    Objective:  Blood pressure 109/73, pulse 68, weight 66.2 kg (146 lb), unknown if currently breastfeeding.  General:  alert, cooperative and no distress   Breasts:  inspection negative, no nipple discharge or bleeding, no masses or nodularity palpable, engorged right breast  Lungs: normal rate and effort  Heart:  regular rate and rhythm  Abdomen: soft, non-tender; bowel sounds normal; no masses,  no organomegaly   Vulva:  normal  Vagina: normal vagina, minimal suture still present at introitus.   Psych:  normal mood and affect  Skin: warm and dry        Assessment:   Normal postpartum exam. Pap smear not done at today's visit.   Plan:   1. Contraception: declined; counseled  2. Breastfeeding: discussed engorgement and ways to prevent this. Handout also provided 3. Follow up in: 1 year for annual or as needed.   In-person Teacher, English as a foreign language used for [employer]  Caryl Ada, DO OB Fellow [facility] for [facility], [facility]

## 2018-03-11 ENCOUNTER — Encounter: Payer: Self-pay | Admitting: Obstetrics and Gynecology

## 2018-07-15 ENCOUNTER — Ambulatory Visit (INDEPENDENT_AMBULATORY_CARE_PROVIDER_SITE_OTHER): Payer: Medicaid Other | Admitting: Internal Medicine

## 2018-07-15 ENCOUNTER — Encounter: Payer: Self-pay | Admitting: Internal Medicine

## 2018-07-15 VITALS — BP 110/68 | HR 72 | Wt 146.1 lb

## 2018-07-15 DIAGNOSIS — R102 Pelvic and perineal pain: Secondary | ICD-10-CM

## 2018-07-15 NOTE — Progress Notes (Signed)
C/o pain in perineal area since delivery when opens legs, very sensitive.

## 2018-07-16 NOTE — Progress Notes (Signed)
   Subjective:    Sheri Perez - 27 y.o. female MRN [id number]  Date of birth: 04-07-1991  HPI  Sheri Perez is a 27 y.o. [id number] female here for perineal pain. Reports this has been present since her delivery in April 2019. Has not been able to tolerate intercourse secondary to pain. Has pain with abduction of her legs. Has not tried to use a tampon. Denies drainage, erythema or edema at the area. No fevers.      OB History    Gravida  2   Para  2   Term  2   Preterm  0   AB      Living  2     SAB      TAB      Ectopic      Multiple  0   Live Births  2           -  reports that she has never smoked. She has never used smokeless tobacco. - Review of Systems: Per HPI. - Past Medical History: Patient Active Problem List   Diagnosis Date Noted  . SVD (spontaneous vaginal delivery) 01/31/2018  . Hemorrhoids during pregnancy 12/18/2017  . Language barrier 10/15/2017  . Supervision of other normal pregnancy, antepartum 07/30/2017  . Family history of diabetes mellitus in father 07/23/2017   - Medications: reviewed and updated   Objective:   Physical Exam BP 110/68   Pulse 72   Wt 146 lb 1.6 oz (66.3 kg)   Breastfeeding? Yes   BMI 26.72 kg/m  Gen: NAD, alert, cooperative with exam, well-appearing GU/GYN: Exam performed in the presence of a chaperone. External genitalia within normal limits. Faint linear scar present on perineum without erythema, edema or drainage. Patient very tender to light palpation of perineum. Vaginal mucosa pink, moist, normal rugae. No pain with palpation of posterior, anterior or side walls of vagina.      Assessment & Plan:   1. Pelvic pain Reviewed delivery note and noted that patient had median episiotomy with repair of 2nd degree laceration. No evidence of infection on exam. Patient has no internal pain on exam. Discussed that superficial nerve regeneration after birth trauma may be playing a role.  Encouraged use of heat, ice, ibuprofen for pain. Patient is agreeable to trying pelvic rehab.  - Ambulatory referral to Physical Therapy   Routine preventative health maintenance measures emphasized. Please refer to After Visit Summary for other counseling recommendations.   No follow-ups on file.  Marcy Siren, D.O. OB Fellow  07/16/2018, 5:26 PM

## 2018-07-25 ENCOUNTER — Encounter: Payer: Self-pay | Admitting: Physical Therapy

## 2018-07-25 ENCOUNTER — Other Ambulatory Visit: Payer: Self-pay

## 2018-07-25 ENCOUNTER — Ambulatory Visit: Payer: Medicaid Other | Attending: Internal Medicine | Admitting: Physical Therapy

## 2018-07-25 DIAGNOSIS — M62838 Other muscle spasm: Secondary | ICD-10-CM | POA: Diagnosis present

## 2018-07-25 DIAGNOSIS — M6281 Muscle weakness (generalized): Secondary | ICD-10-CM | POA: Insufficient documentation

## 2018-07-25 NOTE — Therapy (Signed)
[facility] [address] [place], [place], [address] Phone: [phone]   Fax:  [phone]  Physical Therapy Evaluation  Patient Details  Name: Sheri Perez MRN: [id number] Date of Birth: 1991-03-28 Referring Provider (PT): Dr. Melanee Spry   Encounter Date: 07/25/2018  PT End of Session - 07/25/18 1057    Visit Number  1    Date for PT Re-Evaluation  10/17/18    Authorization Type  Medicaid    PT Start Time  1023   came late   PT Stop Time  1100    PT Time Calculation (min)  37 min    Activity Tolerance  Patient tolerated treatment well    Behavior During Therapy  [facility] for tasks assessed/performed       Past Medical History:  Diagnosis Date  . Medical history non-contributory     Past Surgical History:  Procedure Laterality Date  . NO PAST SURGERIES      There were no vitals filed for this visit.   Subjective Assessment - 07/25/18 1024    Subjective  Patient started on 01/31/2018 with vaginal birth. Patient reports her pain is where the episiotomy is.     Patient is accompained by:  Interpreter    Patient Stated Goals  reduce pain    Currently in Pain?  Yes    Pain Score  5     Pain Location  Vagina    Pain Orientation  Mid    Pain Descriptors / Indicators  Tightness;Sore;Discomfort    Pain Type  Acute pain    Pain Onset  More than a month ago    Pain Frequency  Intermittent    Aggravating Factors   intercourse, open legs,     Pain Relieving Factors  walking    Multiple Pain Sites  No         [facility] PT Assessment - 07/25/18 0001      Assessment   Medical Diagnosis  R10.2 Pelvic pain    Referring Provider (PT)  Dr. Melanee Spry    Onset Date/Surgical Date  01/31/18    Prior Therapy  none      Precautions   Precautions  None      Restrictions   Weight Bearing Restrictions  No      Balance Screen   Has the patient fallen in the past 6 months  No    Has the patient had a decrease in activity level because of a fear of falling?   No    Is the patient reluctant to leave their home because of a fear of falling?   No      Home Environment   Living Environment  Private residence    Living Arrangements  Spouse/significant other    Type of Home  House      Prior Function   Level of Independence  Independent      Cognition   Overall Cognitive Status  Within Functional Limits for tasks assessed      Posture/Postural Control   Posture/Postural Control  Postural limitations    Postural Limitations  Rounded Shoulders;Forward head;Decreased lumbar lordosis      ROM / Strength   AROM / PROM / Strength  AROM;PROM;Strength      Strength   Right Hip Flexion  4/5    Right Hip ABduction  4+/5    Right Hip ADduction  3+/5    Left Hip Flexion  4/5  Left Hip Extension  4/5    Left Hip ADduction  4/5      Palpation   SI assessment   right ilium anterior rotated; right pubic bone is lower,     Palpation comment  tenderness located in left psoas, right diaphgram;       Special Tests   Sacroiliac Tests   Gaenslen's Test      Gaenslen's test   Findings  Positive    Side   Right    Comments  pain                Objective measurements completed on examination: See above findings.    Pelvic Floor Special Questions - 07/25/18 0001    Prior Pregnancies  Yes    Number of Pregnancies  1    Number of Vaginal Deliveries  1    Any difficulty with labor and deliveries  Yes    Episiotomy Performed  Yes    Diastasis Recti  none    Currently Sexually Active  Yes    Marinoff Scale  pain interrupts completion    Urinary Leakage  No    Fecal incontinence  No    Scar  Episiotomy;Painful;Restricted    External Palpation  tenderness located on the left ichiocavernosus    Prolapse  None    Pelvic Floor Internal Exam  Patient confirms with interpreter to let the PT assess the pelvic floor and approve treatment    Exam Type  Vaginal     Palpation  tenderness located on the pernieal body and posterior introitus    Strength  fair squeeze, definite lift               PT Education - 07/25/18 1142    Education Details  educated patient on how to desentize the scar and circular massage around the perineum.     Person(s) Educated  Patient;Other (comment)   interpreter   Methods  Explanation;Demonstration    Comprehension  Verbalized understanding       PT Short Term Goals - 07/25/18 1151      PT SHORT TERM GOAL #1   Title  independent with initial HEP    Baseline  not educated yet    Time  4    Period  Weeks    Status  New    Target Date  08/22/18      PT SHORT TERM GOAL #2   Title  ability to tolerate scar massage with manual work due to scar desentize    Time  4    Period  Weeks    Status  New    Target Date  08/22/18        PT Long Term Goals - 07/25/18 1152      PT LONG TERM GOAL #1   Title  independent with HEP and understand how to progress herself    Time  12    Period  Weeks    Status  New    Target Date  10/17/18      PT LONG TERM GOAL #2   Title  abilty to have penile penetration with pain level </= 1/10 due to improve mobilty of scar from episiotomy    Baseline  pain level 6/10, unable to touch the scar    Time  12    Period  Weeks    Status  New    Target Date  10/17/18      PT LONG TERM GOAL #3  Title  ability to separate her legs without pain due to increased flexibiliy of scar tissue from episomity    Baseline  pain level 6/10    Time  12    Period  Weeks    Status  New    Target Date  10/17/18      PT LONG TERM GOAL #4   Title  abilty to bend over to pick up items with no pain due to pelvis in correct alignment    Baseline  right ilium is anteriorly rotated    Time  12    Period  Weeks    Status  New    Target Date  10/17/18             Plan - 07/25/18 1145    Clinical Impression Statement  Patient is a 27 year old female with pelvic pain that started in  01/31/2018 when she had vaginal delivery. Patient reports her intermitttent pain level is 5/10 with penile penetration, vaginal exam, and opening her legs. Pelvic floor strength is 3/5 holding for 5 seconds. Palpable tenderness located in the perineal body, posterior introitus, left ischiocavernosus,left psoas, and  right diaphgram.  No urinary leakage or diastasis Recti.  Marinoff score is 2/3.  Bilateral hip strength is 4/5. Abdominal strength is 2/5. Positive Gaelsens test on the right with pain. Right ilium is rotated anteriorly and right pubic bone is lower than left. Patient will benefit from skilled therapy to reduce pain with penile penetration and vaginal exam while improving tissue mobility and strength.      History and Personal Factors relevant to plan of care:  Vaginal birth 01/31/2018    Clinical Presentation  Stable    Clinical Presentation due to:  stable condition    Clinical Decision Making  Low    Rehab Potential  Excellent    Clinical Impairments Affecting Rehab Potential  none    PT Frequency  1x / week    PT Duration  12 weeks    PT Treatment/Interventions  Cryotherapy;Biofeedback;Electrical Stimulation;Moist Heat;Therapeutic activities;Therapeutic exercise;Patient/family education;Neuromuscular re-education;Manual techniques;Scar mobilization;Passive range of motion;Dry needling    PT Next Visit Plan  desentisization of scar, abdominal exercise; correct pelvis, soft tissue work to psoas and diaphgram, hip stretches    Consulted and Agree with Plan of Care  Patient       Patient will benefit from skilled therapeutic intervention in order to improve the following deficits and impairments:  Increased fascial restricitons, Pain, Increased muscle spasms, Decreased activity tolerance, Decreased strength  Visit Diagnosis: Other muscle spasm - Plan: PT plan of care cert/re-cert  Muscle weakness (generalized) - Plan: PT plan of care cert/re-cert     Problem List Patient Active  Problem List   Diagnosis Date Noted  . SVD (spontaneous vaginal delivery) 01/31/2018  . Hemorrhoids during pregnancy 12/18/2017  . Language barrier 10/15/2017  . Supervision of other normal pregnancy, antepartum 07/30/2017  . Family history of diabetes mellitus in father 07/23/2017    Eulis Foster, PT 07/25/18 11:57 AM   [facility] [address], [address] [place], [place], [address] Phone: [phone]   Fax:  [phone]  Name: Sheri Perez MRN: [id number] Date of Birth: 11-06-1990

## 2018-08-07 ENCOUNTER — Ambulatory Visit: Payer: Medicaid Other | Admitting: Physical Therapy

## 2018-08-14 ENCOUNTER — Ambulatory Visit: Payer: Medicaid Other | Admitting: Physical Therapy

## 2018-08-14 ENCOUNTER — Encounter: Payer: Self-pay | Admitting: Physical Therapy

## 2018-08-14 DIAGNOSIS — M62838 Other muscle spasm: Secondary | ICD-10-CM | POA: Diagnosis not present

## 2018-08-14 DIAGNOSIS — M6281 Muscle weakness (generalized): Secondary | ICD-10-CM

## 2018-08-14 NOTE — Patient Instructions (Signed)
Access Code: [email or web address]  URL: [email or web address]  Date: 08/14/2018  Prepared by: Eulis Foster   Exercises  Child's Pose Stretch - 1 reps - 1 sets - 30 sec hold - 1x daily - 7x weekly  Child's Pose with Sidebending - 1 reps - 1 sets - 30 sec hold - 1x daily - 7x weekly  Prone Press Up - 3 reps - 1 sets - 10 hold - 1x daily - 7x weekly  Cat-Camel - 10 reps - 1 sets - 1x daily - 7x weekly  Supine Pelvic Floor Stretch - 1 reps - 1 sets - 1 min hold - 1x daily - 7x weekly  Supine Piriformis Stretch Pulling Heel to Hip - 2 reps - 1 sets - 30 sec hold - 1x daily - 7x weekly  Supine Hamstring Stretch - 2 reps - 1 sets - 30 sec hold - 1x daily - 7x weekly  Quadriceps Stretch with Chair - 2 reps - 1 sets - 30 sec hold - 1x daily - 7x weekly  [facility] [address] [place], [place] [address] Phone # [phone] Fax [phone]

## 2018-08-14 NOTE — Therapy (Signed)
[facility] [address], [address] [place], [place], [address] Phone: [phone]   Fax:  [phone]  Physical Therapy Treatment  Patient Details  Name: Sheri Perez MRN: [id number] Date of Birth: 11/05/1990 Referring Provider (PT): Dr. Melanee Spry   Encounter Date: 08/14/2018  PT End of Session - 08/14/18 0928    Visit Number  2    Date for PT Re-Evaluation  10/17/18    Authorization Type  Medicaid    Authorization Time Period  08/01/2018-08/21/2018    Authorization - Visit Number  2    Authorization - Number of Visits  4    PT Start Time  0845    PT Stop Time  0930    PT Time Calculation (min)  45 min    Activity Tolerance  Patient tolerated treatment well    Behavior During Therapy  [facility] for tasks assessed/performed       Past Medical History:  Diagnosis Date  . Medical history non-contributory     Past Surgical History:  Procedure Laterality Date  . NO PAST SURGERIES      There were no vitals filed for this visit.  Subjective Assessment - 08/14/18 0855    Subjective  I feel alot better from last visit. Mentality has helped and everything is okay. I have been able to stretch better.     Patient is accompained by:  Interpreter    Patient Stated Goals  reduce pain    Currently in Pain?  Yes    Pain Score  5     Pain Location  Vagina    Pain Orientation  Mid    Pain Descriptors / Indicators  Tightness;Sore;Discomfort    Pain Type  Acute pain    Pain Onset  More than a month ago    Pain Frequency  Intermittent    Aggravating Factors   intercourse, open legs    Pain Relieving Factors  walking    Multiple Pain Sites  No                    Pelvic Floor Special Questions - 08/14/18 0001    Pelvic Floor Internal Exam  Patient confirms with interpreter to let the PT assess the pelvic floor and approve treatment    Exam Type  Vaginal        [facility] Adult PT  Treatment/Exercise - 08/14/18 0001      Exercises   Exercises  Lumbar      Lumbar Exercises: Stretches   Active Hamstring Stretch  Right;Left;1 rep;30 seconds   supine   Hip Flexor Stretch  Right;Left;1 rep;30 seconds   standing   Press Ups  2 reps;10 reps   prone   Quadruped Mid Back Stretch  3 reps;30 seconds   all 3 ways prone   Piriformis Stretch  Right;Left;2 reps;30 seconds   supine   Other Lumbar Stretch Exercise  cat cow 10 times; happy baby      Lumbar Exercises: Quadruped   Madcat/Old Horse  10 reps      Manual Therapy   Manual Therapy  Internal Pelvic Floor    Internal Pelvic Floor  gentle scar massage to posterior fourchette; myofascial release to the introitus with one finger internal and other along inner thigh             PT Education - 08/14/18 0912    Education Details  Access Code: [email or web address]     Person(s) Educated  Patient;Other (comment)   interpreter   Methods  Explanation;Demonstration;Verbal cues;Handout    Comprehension  Returned demonstration;Verbalized understanding       PT Short Term Goals - 08/14/18 0939      PT SHORT TERM GOAL #1   Title  independent with initial HEP    Time  4    Period  Weeks    Status  Achieved      PT SHORT TERM GOAL #2   Title  ability to tolerate scar massage with manual work due to scar desentize    Time  4    Period  Weeks    Status  On-going        PT Long Term Goals - 07/25/18 1152      PT LONG TERM GOAL #1   Title  independent with HEP and understand how to progress herself    Time  12    Period  Weeks    Status  New    Target Date  10/17/18      PT LONG TERM GOAL #2   Title  abilty to have penile penetration with pain level </= 1/10 due to improve mobilty of scar from episiotomy    Baseline  pain level 6/10, unable to touch the scar    Time  12    Period  Weeks    Status  New    Target Date  10/17/18      PT LONG TERM GOAL #3   Title  ability to separate her legs without pain due to  increased flexibiliy of scar tissue from episomity    Baseline  pain level 6/10    Time  12    Period  Weeks    Status  New    Target Date  10/17/18      PT LONG TERM GOAL #4   Title  abilty to bend over to pick up items with no pain due to pelvis in correct alignment    Baseline  right ilium is anteriorly rotated    Time  12    Period  Weeks    Status  New    Target Date  10/17/18            Plan - 08/14/18 0910    Clinical Impression Statement  Patient was very sensitive with internal work.Patient only alble to tolerate light touch. Patient is able to get into positions for stretches. She has the most difficulty with hamstring and quad stretch. Patient  will benefit from skilled therpy to reduce pain with penile penetration and vaginal exam while improving tissue mobility and strength.     Rehab Potential  Excellent    PT Frequency  1x / week    PT Duration  12 weeks    PT Treatment/Interventions  Cryotherapy;Biofeedback;Electrical Stimulation;Moist Heat;Therapeutic activities;Therapeutic exercise;Patient/family education;Neuromuscular re-education;Manual techniques;Scar mobilization;Passive range of motion;Dry needling    PT Next Visit Plan  desentisization of scar, abdominal exercise; correct pelvis, soft tissue work to psoas and diaphgram    PT Home Exercise Plan  Access Code: [email or web address]     Recommended Other Services  MD signed initial eval    Consulted and Agree with Plan of Care  Patient;Other (Comment)   interpreter      Patient will benefit from skilled therapeutic intervention in order to improve the following deficits and impairments:  Increased fascial restricitons, Pain, Increased muscle spasms, Decreased activity tolerance, Decreased strength  Visit Diagnosis: Other muscle spasm  Muscle weakness (generalized)  Problem List Patient Active Problem List   Diagnosis Date Noted  . SVD (spontaneous vaginal delivery) 01/31/2018  . Hemorrhoids during  pregnancy 12/18/2017  . Language barrier 10/15/2017  . Supervision of other normal pregnancy, antepartum 07/30/2017  . Family history of diabetes mellitus in father 07/23/2017    Eulis Foster, PT 08/14/18 9:46 AM   [facility] [address] [place], [place], [address] Phone: [phone]   Fax:  [phone]  Name: Sheri Perez MRN: [id number] Date of Birth: 24-Jun-1991

## 2018-08-21 ENCOUNTER — Ambulatory Visit: Payer: Medicaid Other | Admitting: Physical Therapy

## 2018-09-05 ENCOUNTER — Encounter: Payer: Self-pay | Admitting: Physical Therapy

## 2018-09-05 ENCOUNTER — Ambulatory Visit: Payer: Medicaid Other | Attending: Internal Medicine | Admitting: Physical Therapy

## 2018-09-05 DIAGNOSIS — M62838 Other muscle spasm: Secondary | ICD-10-CM | POA: Diagnosis present

## 2018-09-05 DIAGNOSIS — M6281 Muscle weakness (generalized): Secondary | ICD-10-CM | POA: Insufficient documentation

## 2018-09-05 NOTE — Therapy (Signed)
[facility] [address], [place], [place], [address] Phone: [phone]   Fax:  [phone]  Physical Therapy Treatment  Patient Details  Name: Sheri Perez MRN: [id number] Date of Birth: 05-22-1991 Referring Provider (PT): Dr. Vaughan Browner   Encounter Date: 09/05/2018  PT End of Session - 09/05/18 1306    Visit Number  3    Date for PT Re-Evaluation  10/17/18    Authorization Type  Medicaid    Authorization Time Period  08/27/2018-10/14/2018    Authorization - Visit Number  3    Authorization - Number of Visits  4    PT Start Time  1230    PT Stop Time  1300   baby was getting cranky   PT Time Calculation (min)  30 min    Activity Tolerance  Patient tolerated treatment well    Behavior During Therapy  [facility] for tasks assessed/performed       Past Medical History:  Diagnosis Date  . Medical history non-contributory     Past Surgical History:  Procedure Laterality Date  . NO PAST SURGERIES      There were no vitals filed for this visit.  Subjective Assessment - 09/05/18 1235    Subjective  I have been working on the exercises I was given. I am feeling better. The pain is 90% better.     Patient is accompained by:  Interpreter    Patient Stated Goals  reduce pain    Currently in Pain?  Yes    Pain Score  2     Pain Location  Vagina    Pain Orientation  Mid    Pain Descriptors / Indicators  Tightness;Sore;Discomfort    Pain Type  Acute pain    Pain Onset  More than a month ago    Pain Frequency  Intermittent    Aggravating Factors   instercourse,     Pain Relieving Factors  walking    Multiple Pain Sites  No         [facility] PT Assessment - 09/05/18 0001      Assessment   Medical Diagnosis  R10.2 Pelvic pain    Referring Provider (PT)  Dr. Vaughan Browner    Onset Date/Surgical Date  01/31/18    Prior Therapy  none      Precautions   Precautions  None       Home Environment   Living Environment  Private residence    Living Arrangements  Spouse/significant other    Type of [place]      Prior Function   Level of Independence  Independent      Cognition   Overall Cognitive Status  Within Functional Limits for tasks assessed      Posture/Postural Control   Posture/Postural Control  Postural limitations    Postural Limitations  Rounded Shoulders;Forward head;Decreased lumbar lordosis      Strength   Right Hip Flexion  4/5    Right Hip ABduction  4+/5    Right Hip ADduction  3+/5    Left Hip Flexion  4/5    Left Hip Extension  4/5    Left Hip ADduction  4/5      Palpation   SI assessment   pelvis in correct alignment    Palpation comment  tenderness located in left psoas, right diaphgram;       Special Tests   Sacroiliac Tests   Gaenslen's Test  Gaenslen's test   Findings  Negative    Side   Right    Comments  no pain                Pelvic Floor Special Questions - 09/05/18 0001    Currently Sexually Active  Yes    Marinoff Scale  discomfort that does not affect completion    Exam Type  Deferred   baby was cranky       [facility] Adult PT Treatment/Exercise - 09/05/18 0001      Exercises   Exercises  Lumbar      Lumbar Exercises: Stretches   Quadruped Mid Back Stretch  3 reps;30 seconds   all 3 ways prone     Lumbar Exercises: Supine   Ab Set  10 reps;5 seconds    Clam  20 reps;1 second   with abdominal bracing   Bridge  10 reps      Lumbar Exercises: Sidelying   Hip Abduction  Left;Right;10 reps;1 second    Other Sidelying Lumbar Exercises  hip adduction, left, right 10x      Lumbar Exercises: Quadruped   Opposite Arm/Leg Raise  Right arm/Left leg;Left arm/Right leg;10 reps;1 second             PT Education - 09/05/18 1257    Education Details  Access Code: [email or web address]    Person(s) Educated  Patient;Other (comment)   interpreter   Methods  Explanation;Demonstration;Verbal cues;Handout     Comprehension  Returned demonstration;Verbalized understanding       PT Short Term Goals - 09/05/18 1311      PT SHORT TERM GOAL #1   Title  independent with initial HEP    Time  4    Period  Weeks    Status  Achieved      PT SHORT TERM GOAL #2   Title  ability to tolerate scar massage with manual work due to scar desentize    Time  4    Period  Weeks    Status  Achieved        PT Long Term Goals - 09/05/18 1317      PT LONG TERM GOAL #1   Title  independent with HEP and understand how to progress herself    Time  12    Period  Weeks    Status  Achieved      PT LONG TERM GOAL #2   Title  abilty to have penile penetration with pain level </= 1/10 due to improve mobilty of scar from episiotomy    Time  12    Period  Weeks    Status  Achieved      PT LONG TERM GOAL #3   Title  ability to separate her legs without pain due to increased flexibiliy of scar tissue from episomity    Time  12    Period  Weeks    Status  Achieved      PT LONG TERM GOAL #4   Title  abilty to bend over to pick up items with no pain due to pelvis in correct alignment    Time  12    Period  Weeks    Status  Achieved            Plan - 09/05/18 1318    Clinical Impression Statement  Patient reports she is 95% better. Patient is able to perform her own pernieal massage without difficulty. Patient able to spread her legs without   difficulty. Patient understands how to progress her HEP. Patient reports it is getting difficult for her to come to therapy due to not having anyone watch her baby. Patient wants to be discharged.     Rehab Potential  Excellent    Clinical Impairments Affecting Rehab Potential  none    PT Frequency  1x / week    PT Duration  12 weeks    PT Treatment/Interventions  Cryotherapy;Biofeedback;Electrical Stimulation;Moist Heat;Therapeutic activities;Therapeutic exercise;Patient/family education;Neuromuscular re-education;Manual techniques;Scar mobilization;Passive range  of motion;Dry needling    PT Next Visit Plan  Discharge to HEP this visit    PT Home Exercise Plan  Access Code: [email or web address]     Consulted and Agree with Plan of Care  Patient;Other (Comment)       Patient will benefit from skilled therapeutic intervention in order to improve the following deficits and impairments:  Increased fascial restricitons, Pain, Increased muscle spasms, Decreased activity tolerance, Decreased strength  Visit Diagnosis: Other muscle spasm  Muscle weakness (generalized)     Problem List Patient Active Problem List   Diagnosis Date Noted  . SVD (spontaneous vaginal delivery) 01/31/2018  . Hemorrhoids during pregnancy 12/18/2017  . Language barrier 10/15/2017  . Supervision of other normal pregnancy, antepartum 07/30/2017  . Family history of diabetes mellitus in father 07/23/2017    Cheryl Gray, PT 09/05/18 1:21 PM [facility] [address], [address] [place], [place], [address] Phone: [phone]   Fax:  [phone]  Name: Judithe Moncada Aguilar MRN: [id number] Date of Birth: 05/28/1991  PHYSICAL THERAPY DISCHARGE SUMMARY  Visits from Start of Care: 3  Current functional level related to goals / functional outcomes: See above.    Remaining deficits: See above.    Education / Equipment: HEP  Plan: Patient agrees to discharge.  Patient goals were met. Patient is being discharged due to being pleased with the current functional level.  Thank you for the referral. Cheryl Gray, PT 09/05/18 1:21 PM  ?????      

## 2018-09-05 NOTE — Patient Instructions (Signed)
Access Code: [email or web address]  URL: [email or web address]  Date: 09/05/2018  Prepared by: Eulis Fosterheryl Gray   Exercises  Child's Pose Stretch - 1 reps - 1 sets - 30 sec hold - 1x daily - 7x weekly  Child's Pose with Sidebending - 1 reps - 1 sets - 30 sec hold - 1x daily - 7x weekly  Prone Press Up - 3 reps - 1 sets - 10 hold - 1x daily - 7x weekly  Cat-Camel - 10 reps - 1 sets - 1x daily - 7x weekly  Supine Pelvic Floor Stretch - 1 reps - 1 sets - 1 min hold - 1x daily - 7x weekly  Supine Piriformis Stretch Pulling Heel to Hip - 2 reps - 1 sets - 30 sec hold - 1x daily - 7x weekly  Supine Hamstring Stretch - 2 reps - 1 sets - 30 sec hold - 1x daily - 7x weekly  Quadriceps Stretch with Chair - 2 reps - 1 sets - 30 sec hold - 1x daily - 7x weekly  Supine Transversus Abdominis Bracing - Hands on Stomach - 10 reps - 1 sets - 5 sec hold - 1x daily - 7x weekly  Beginner Bridge - 10 reps - 1 sets - 1x daily - 7x weekly  Sidelying Hip Abduction - 10 reps - 2 sets - 1x daily - 7x weekly  Sidelying Hip Adduction - 10 reps - 1 sets - 1x daily - 7x weekly  Bird Dog - 10 reps - 1 sets - 1x daily - 7x weekly  Bent Knee Fallouts with Alternating Legs - 10 reps - 1 sets - 1x daily - 7x weekly  [facility] [address], [address] [place], [place] [address] Phone # [phone] Fax [phone]

## 2019-07-20 IMAGING — US US OB COMP LESS 14 WK
1 series · 15 of 28 positions shown · non-contrast
Comparison: None.

CLINICAL DATA: Pregnant and bleeding.

EXAM:
OBSTETRIC <14 WK US AND TRANSVAGINAL OB US
TECHNIQUE: Both transabdominal and transvaginal ultrasound examinations were
performed for complete evaluation of the gestation as well as the
maternal uterus, adnexal regions, and pelvic cul-de-sac.
Transvaginal technique was performed to assess early pregnancy.

[Series 1: us ob comp less 14 wk · 15 of 62 slices shown]
[im 1/62]
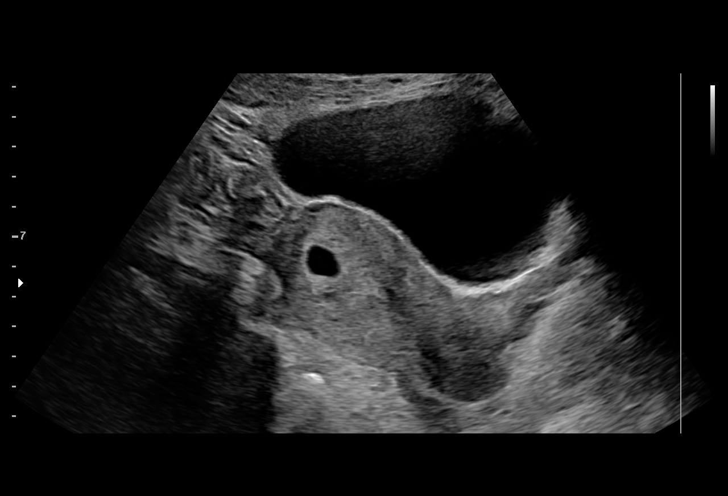
[im 5/62]
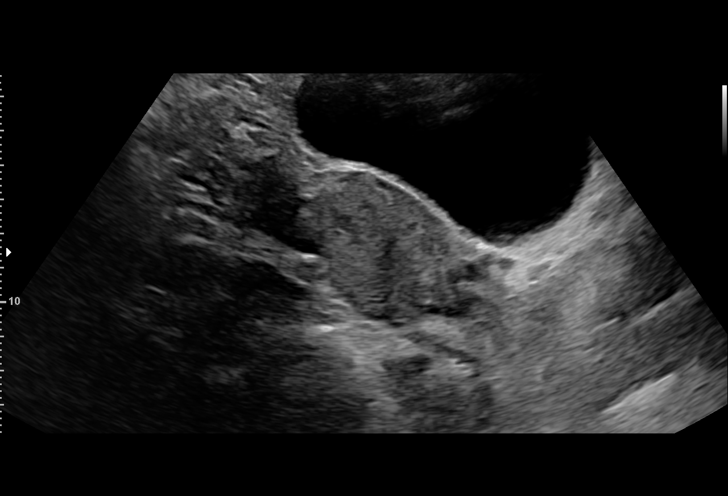
[im 10/62]
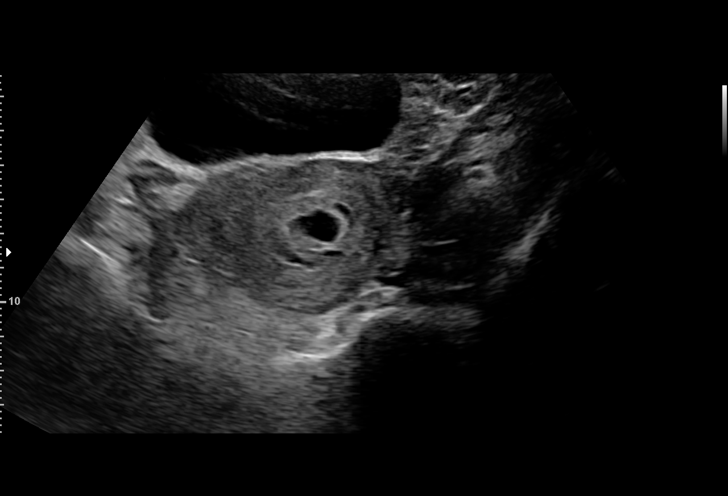
[im 14/62]
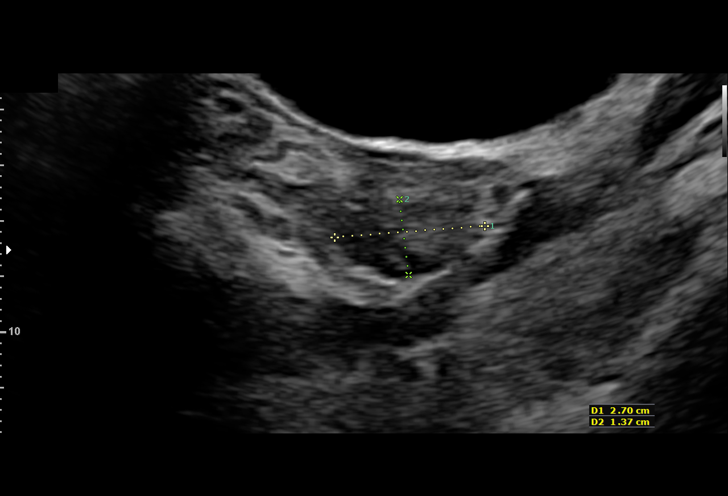
[im 19/62]
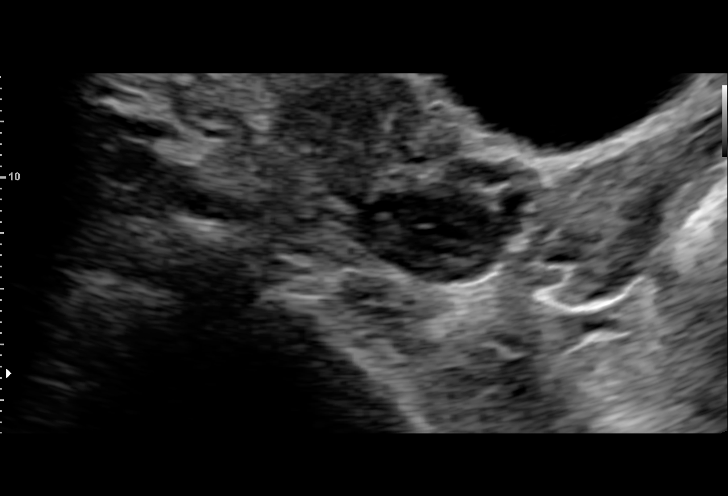
[im 23/62]
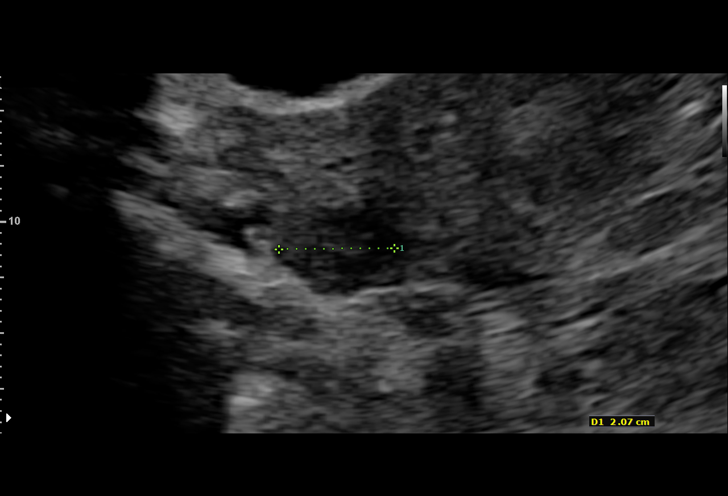
[im 28/62]
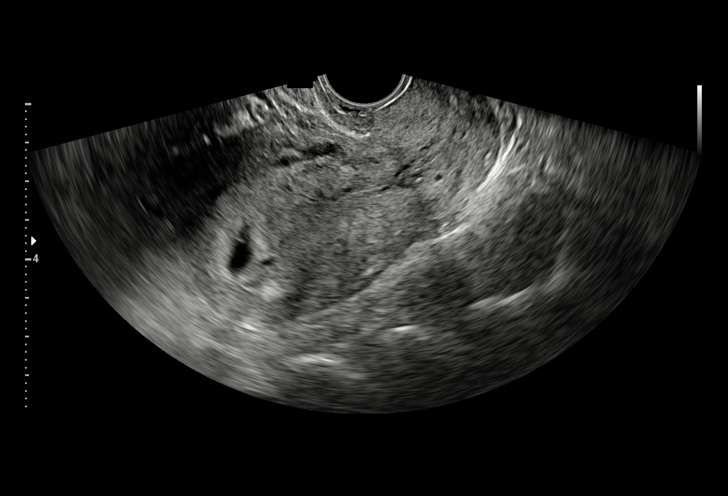
[im 32/62]
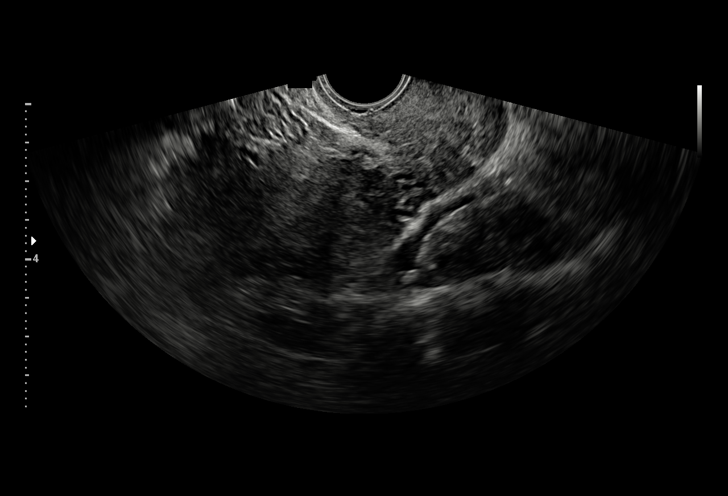
[im 34/62]
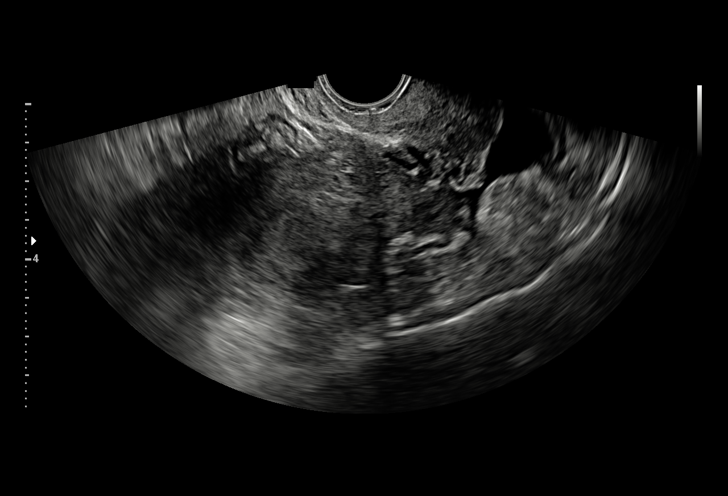
[im 39/62]
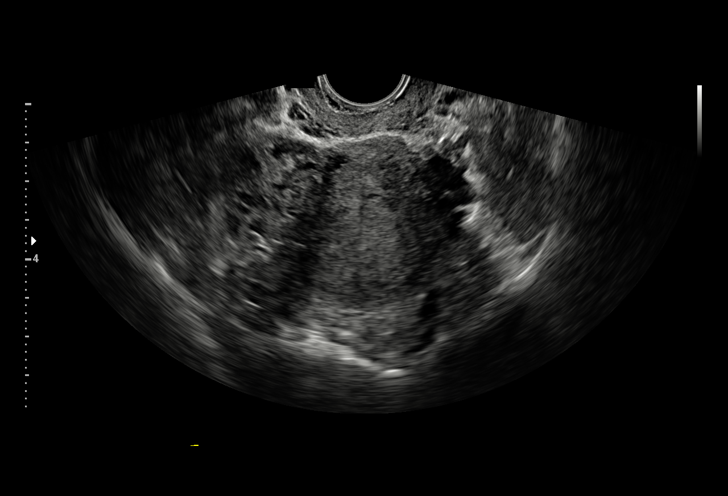
[im 43/62]
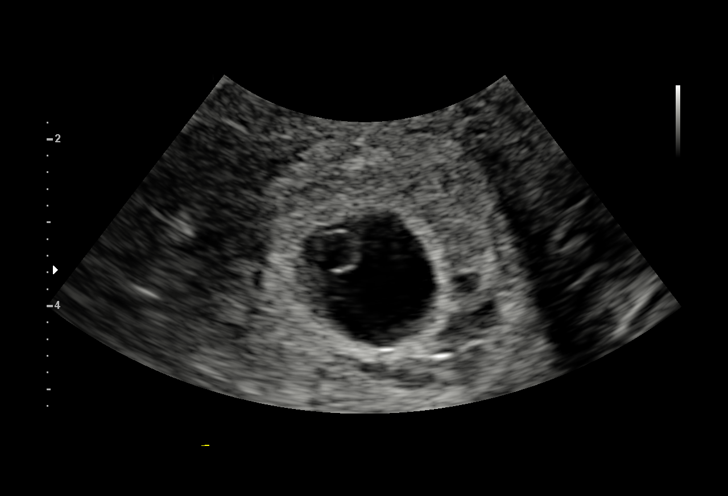
[im 48/62]
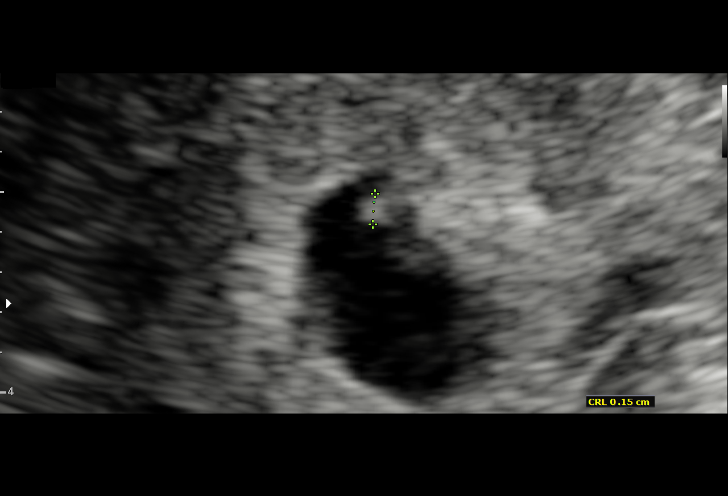
[im 52/62]
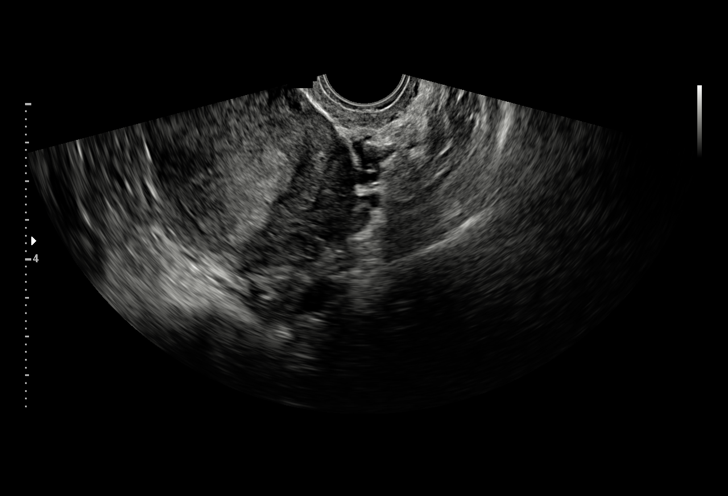
[im 57/62]
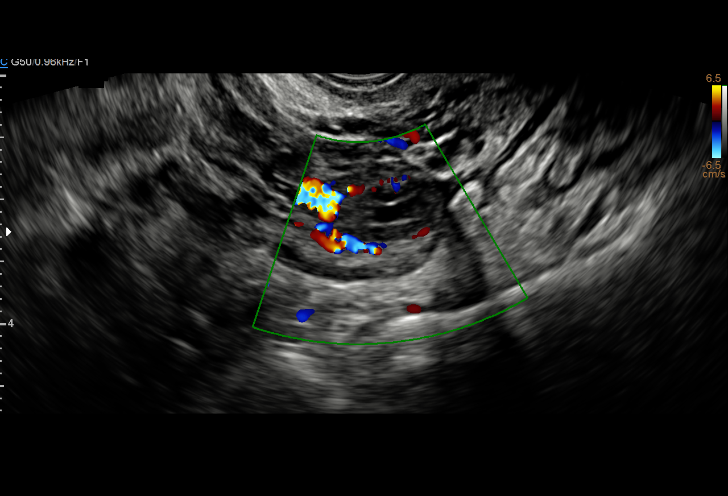
[im 62/62]
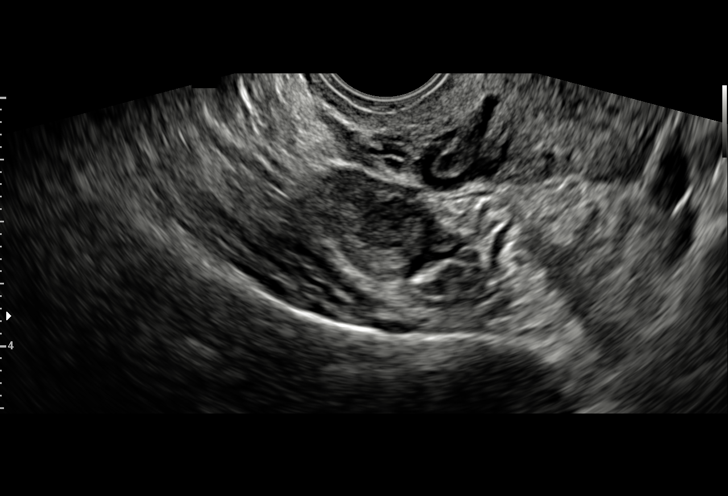

[15 of 28 positions shown; findings below may reference images not displayed]

FINDINGS: Intrauterine gestational sac: Present

Yolk sac:  Present

Embryo:  Present

Cardiac Activity: Present

Heart Rate: 92  bpm

MSD:   mm    w     d

CRL:  1.5  mm   5 w   3 d                  US EDC: February 09, 2018

Subchorionic hemorrhage:  None visualized.

Maternal uterus/adnexae: Left ovary is normal. Corpus luteal cyst
measuring 1.5 cm is identified in the right ovary. Trace free fluid
is identified in the pelvis.
IMPRESSION: Single live intrauterine gestation measuring to 5 weeks and 3 days.
No complications.

## 2020-08-25 ENCOUNTER — Ambulatory Visit (HOSPITAL_COMMUNITY)
Admission: EM | Admit: 2020-08-25 | Discharge: 2020-08-25 | Disposition: A | Discharge status: Home / Self Care | Payer: Medicaid Other | Attending: Physician Assistant | Admitting: Physician Assistant

## 2020-08-25 ENCOUNTER — Encounter (HOSPITAL_COMMUNITY): Payer: Self-pay

## 2020-08-25 ENCOUNTER — Other Ambulatory Visit: Payer: Self-pay

## 2020-08-25 DIAGNOSIS — Z20822 Contact with and (suspected) exposure to covid-19: Secondary | ICD-10-CM | POA: Diagnosis not present

## 2020-08-25 DIAGNOSIS — J029 Acute pharyngitis, unspecified: Secondary | ICD-10-CM | POA: Diagnosis not present

## 2020-08-25 DIAGNOSIS — R110 Nausea: Secondary | ICD-10-CM | POA: Diagnosis not present

## 2020-08-25 DIAGNOSIS — R112 Nausea with vomiting, unspecified: Secondary | ICD-10-CM

## 2020-08-25 DIAGNOSIS — R109 Unspecified abdominal pain: Secondary | ICD-10-CM | POA: Diagnosis not present

## 2020-08-25 LAB — POCT RAPID STREP A, ED / UC: Streptococcus, Group A Screen (Direct): NEGATIVE

## 2020-08-25 MED ORDER — ACETAMINOPHEN 325 MG PO TABS
ORAL_TABLET | ORAL | Status: AC
  Filled 2020-08-25: qty 2

## 2020-08-25 MED ORDER — ONDANSETRON 4 MG PO TBDP
ORAL_TABLET | ORAL | Status: AC
  Filled 2020-08-25: qty 1

## 2020-08-25 MED ORDER — ONDANSETRON 4 MG PO TBDP
4.0000 mg | ORAL_TABLET | Freq: Once | ORAL | Status: AC
  Administered 2020-08-25: 4 mg via ORAL

## 2020-08-25 MED ORDER — ACETAMINOPHEN 325 MG PO TABS
650.0000 mg | ORAL_TABLET | Freq: Four times a day (QID) | ORAL | Status: DC | PRN
  Administered 2020-08-25: 650 mg via ORAL

## 2020-08-25 NOTE — ED Provider Notes (Signed)
[facility]    CSN: [id number] Arrival date & time: 08/25/20  1839      History   Chief Complaint Chief Complaint  Patient presents with  . Abdominal Pain  . Sore Throat  . Dizziness    HPI Sheri Perez is a 29 y.o. female.   The history is provided by the patient.  Abdominal Pain Pain location:  Generalized Pain quality: aching   Pain radiates to:  Does not radiate Pain severity:  Moderate Onset quality:  Gradual Timing:  Constant Progression:  Worsening Chronicity:  New Relieved by:  Nothing Worsened by:  Nothing Associated symptoms: nausea   Sore Throat Associated symptoms include abdominal pain.  Dizziness Associated symptoms: nausea   Pt reports she has been losing hair.  Pt reports her throat feels sore.  Pt complains of feeling nauseated. Pt denies pregnancy risk   Past Medical History:  Diagnosis Date  . Medical history non-contributory     Patient Active Problem List   Diagnosis Date Noted  . SVD (spontaneous vaginal delivery) 01/31/2018  . Hemorrhoids during pregnancy 12/18/2017  . Language barrier 10/15/2017  . Supervision of other normal pregnancy, antepartum 07/30/2017  . Family history of diabetes mellitus in father 07/23/2017    Past Surgical History:  Procedure Laterality Date  . NO PAST SURGERIES      OB History    Gravida  2   Para  2   Term  2   Preterm  0   AB      Living  2     SAB      TAB      Ectopic      Multiple  0   Live Births  2            Home Medications    Prior to Admission medications   Medication Sig Start Date End Date Taking? Authorizing Provider  Prenatal Vit-Fe Fumarate-FA (PRENATAL VITAMIN PO) Take 1 tablet by mouth daily.    [provider]    Family History Family History  Problem Relation Age of Onset  . Diabetes Father   . Hypertension Father     Social History Social History   Tobacco Use  . Smoking status: Never Smoker  . Smokeless  tobacco: Never Used  Substance Use Topics  . Alcohol use: No    Alcohol/week: 0.0 standard drinks  . Drug use: No     Allergies   Patient has no known allergies.   Review of Systems Review of Systems  Gastrointestinal: Positive for abdominal pain and nausea.  Neurological: Positive for dizziness.  All other systems reviewed and are negative.    Physical Exam Triage Vital Signs ED Triage Vitals  Enc Vitals Group     BP 08/25/20 1920 119/85     Pulse Rate 08/25/20 1920 80     Resp 08/25/20 1920 18     Temp 08/25/20 1920 98.3 F (36.8 C)     Temp Source 08/25/20 1920 Oral     SpO2 08/25/20 1920 100 %     Weight --      Height --      Head Circumference --      Peak Flow --      Pain Score 08/25/20 1925 7     Pain Loc --      Pain Edu? --      Excl. in GC? --    No data found.  Updated Vital Signs BP  119/85 (BP Location: Right Arm)   Pulse 80   Temp 98.3 F (36.8 C) (Oral)   Resp 18   SpO2 100%   Visual Acuity Right Eye Distance:   Left Eye Distance:   Bilateral Distance:    Right Eye Near:   Left Eye Near:    Bilateral Near:     Physical Exam Vitals and nursing note reviewed.  Constitutional:      Appearance: She is well-developed.  HENT:     Head: Normocephalic.  Cardiovascular:     Rate and Rhythm: Normal rate.  Pulmonary:     Effort: Pulmonary effort is normal.  Abdominal:     General: Bowel sounds are normal. There is no distension.     Palpations: Abdomen is soft.  Musculoskeletal:        General: Normal range of motion.     Cervical back: Normal range of motion.  Skin:    General: Skin is warm.  Neurological:     General: No focal deficit present.     Mental Status: She is alert and oriented to person, place, and time.  Psychiatric:        Mood and Affect: Mood normal.      UC Treatments / Results  Labs (all labs ordered are listed, but only abnormal results are displayed) Labs Reviewed  RESP PANEL BY RT PCR (RSV, FLU A&B,  COVID)  POCT RAPID STREP A, ED / UC    EKG   Radiology No results found.  Procedures Procedures (including critical care time)  Medications Ordered in UC Medications  acetaminophen (TYLENOL) tablet 650 mg (650 mg Oral Given 08/25/20 2028)  ondansetron (ZOFRAN-ODT) disintegrating tablet 4 mg (4 mg Oral Given 08/25/20 2028)    Initial Impression / Assessment and Plan / UC Course  I have reviewed the triage vital signs and the nursing notes.  Pertinent labs & imaging results that were available during my care of the patient were reviewed by me and considered in my medical decision making (see chart for details).     MDM: Strep is negative,  Vitals normal,  covid test pending.   Pt has multiple concerns.  Pt advised to schedule to see primary care for complete evaluation  Final Clinical Impressions(s) / UC Diagnoses   Final diagnoses:  Sore throat  Nausea and vomiting, intractability of vomiting not specified, unspecified vomiting type   Discharge Instructions   None    ED Prescriptions    None     PDMP not reviewed this encounter.   Elson Areas, [place] 08/25/20 2047

## 2020-08-25 NOTE — ED Triage Notes (Signed)
Pt present sore throat with abdominal pain and dizziness. Pt state that it feel like something stuck in her throat, she drinks a lot of water and still feel like something is stuck. Pt states she is having severe stomach pain  With dizziness. The dizziness started two months ago.

## 2020-08-26 LAB — RESP PANEL BY RT PCR (RSV, FLU A&B, COVID)
Influenza A by PCR: NEGATIVE
Influenza B by PCR: NEGATIVE
Respiratory Syncytial Virus by PCR: NEGATIVE
SARS Coronavirus 2 by RT PCR: NEGATIVE

## 2020-08-28 LAB — CULTURE, GROUP A STREP (THRC)

## 2020-10-16 NOTE — L&D Delivery Note (Signed)
OB[facility]N Faculty Practice Delivery Note  Sheri Perez is a 30 y.o. [id number] s/p VD at [redacted]w[redacted]d. She was admitted for IOL 2/2 IUGR.   ROM: 4h 25m with clear-bloody fluid GBS Status:  Negative/-- (10/19 1607) Maximum Maternal Temperature: 98.5  Labor Progress: Initial SVE: closed/thick/-3. S/p FB, pit, AROM. She then progressed to complete.   Delivery Date/Time: 08/09/2021 @0840  Delivery: Called to room and patient was complete and pushing. Head delivered OA. Loose nuchal cord present and reduced during delivery. Shoulder and body delivered in usual fashion. Infant with spontaneous cry, placed on mother's abdomen, dried and stimulated. Cord clamped x 2 after 1-minute delay, and cut by father of infant. Cord blood drawn. Placenta delivered spontaneously with gentle cord traction. Fundus firm with massage and Pitocin. Labia, perineum, vagina, and cervix inspected with 1st degree hemostatic laceration repaired with 1 stitch for proximation.  Baby Weight: pending  Placenta: 3 vessel, intact. Sent to L&D Complications: None Lacerations: 1st degree- 4.0 vicryl repair EBL: 50 mL Analgesia: Epidural   Infant:  APGAR (1 MIN):  9 APGAR (5 MINS):  9  Renezmae Canlas Autry-Lott, DO 08/09/2021, 8:54 AM PGY-3, [facility]

## 2020-11-24 ENCOUNTER — Encounter: Payer: Self-pay | Admitting: Internal Medicine

## 2020-11-24 ENCOUNTER — Ambulatory Visit (INDEPENDENT_AMBULATORY_CARE_PROVIDER_SITE_OTHER): Payer: Medicaid Other | Admitting: Internal Medicine

## 2020-11-24 VITALS — BP 118/67 | HR 76 | Temp 97.1°F | Ht 62.0 in | Wt 170.0 lb

## 2020-11-24 DIAGNOSIS — K219 Gastro-esophageal reflux disease without esophagitis: Secondary | ICD-10-CM

## 2020-11-24 DIAGNOSIS — M549 Dorsalgia, unspecified: Secondary | ICD-10-CM

## 2020-11-24 DIAGNOSIS — F4320 Adjustment disorder, unspecified: Secondary | ICD-10-CM

## 2020-11-24 DIAGNOSIS — M5442 Lumbago with sciatica, left side: Secondary | ICD-10-CM | POA: Diagnosis not present

## 2020-11-24 MED ORDER — FAMOTIDINE 20 MG PO TABS: 20.0000 mg | ORAL_TABLET | Freq: Every day | ORAL | 1 refills | Status: DC
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
## 2020-11-25 ENCOUNTER — Encounter: Payer: Self-pay | Admitting: Internal Medicine

## 2020-11-25 DIAGNOSIS — M549 Dorsalgia, unspecified: Secondary | ICD-10-CM | POA: Insufficient documentation
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
## 2020-11-26 NOTE — Progress Notes (Signed)
Internal Medicine Clinic Attending  Case discussed with Dr. Steen  At the time of the visit.  We reviewed the resident's history and exam and pertinent patient test results.  I agree with the assessment, diagnosis, and plan of care documented in the resident's note.  

## 2020-12-08 ENCOUNTER — Encounter: Payer: Self-pay | Admitting: Internal Medicine

## 2020-12-08 ENCOUNTER — Ambulatory Visit (INDEPENDENT_AMBULATORY_CARE_PROVIDER_SITE_OTHER): Payer: Medicaid Other | Admitting: Internal Medicine

## 2020-12-08 VITALS — BP 104/60 | HR 72 | Temp 98.4°F | Ht 62.0 in | Wt 171.7 lb

## 2020-12-08 DIAGNOSIS — R198 Other specified symptoms and signs involving the digestive system and abdomen: Secondary | ICD-10-CM | POA: Insufficient documentation

## 2020-12-08 DIAGNOSIS — Z13220 Encounter for screening for lipoid disorders: Secondary | ICD-10-CM

## 2020-12-08 DIAGNOSIS — K219 Gastro-esophageal reflux disease without esophagitis: Secondary | ICD-10-CM | POA: Diagnosis present

## 2020-12-08 DIAGNOSIS — Z789 Other specified health status: Secondary | ICD-10-CM

## 2020-12-08 DIAGNOSIS — R0989 Other specified symptoms and signs involving the circulatory and respiratory systems: Secondary | ICD-10-CM | POA: Insufficient documentation

## 2020-12-08 MED ORDER — FAMOTIDINE 20 MG PO TABS: 20.0000 mg | ORAL_TABLET | Freq: Every evening | ORAL | 1 refills | Status: DC

## 2020-12-08 MED ORDER — PRENATAL VITAMIN 27-0.8 MG PO TABS: 1.0000 | ORAL_TABLET | Freq: Every day | ORAL | 1 refills | Status: DC

## 2020-12-08 NOTE — Progress Notes (Signed)
   CC: GERD, attempting to conceive,and screening for hyperlipidemia  HPI:[name] is a 30 y.o. female who presents for evaluation of GERD, attempting to conceive, and screening for hyperlipidemia. Please see individual problem based A/P for details.   Past Medical History:  Diagnosis Date  . GERD (gastroesophageal reflux disease)   . Medical history non-contributory    Review of Systems:   Review of Systems  Constitutional: Negative for chills and fever.  Respiratory: Negative for cough and shortness of breath.      Physical Exam: Vitals:   12/08/20 1004  BP: 104/60  Pulse: 72  Temp: 98.4 F (36.9 C)  TempSrc: Oral  SpO2: 98%  Weight: 171 lb 11.2 oz (77.9 kg)  Height: 5\' 2"  (1.575 m)    General: NAD, well kept HEENT: Normocephalic, atraumatic , Conjunctiva nl  Cardiovascular: Normal rate, regular rhythm.  No murmurs, rubs, or gallops Pulmonary : Equal breath sounds, No wheezes, rales, or rhonchi Abdominal: soft, nontender,  bowel sounds present   Assessment & Plan:   See Encounters Tab for problem based charting.  Patient discussed with Dr. 

## 2020-12-08 NOTE — Assessment & Plan Note (Signed)
Patient reports she is attempting to conceive and would like a pregnancy test today.  Attempting to conceive - Prenatal Vit-Fe Fumarate-FA (PRENATAL VITAMIN) 27-0.8 MG TABS; Take 1 tablet by mouth daily.  Dispense: 90 tablet; Refill: 1 - BMP8+Anion Gap - CBC with Diff - Pregnancy Test, Serum, Qual

## 2020-12-08 NOTE — Patient Instructions (Addendum)
Sheri Perez. Sheri Perez por permitirnos brindarle su atencin hoy. Hoy discutimos. 1. Enfermedad por reflujo gastroesofgico sin esofagitis 2. Intentar concebir 3. Deteccin de hiperlipidemia      He ordenado los siguientes laboratorios para usted:   rdenes de laboratorio     BMP8+Anin Brecha     CBC con diferencial     [place] de [place], [place], Qual     Perfil lipdico  Pruebas ordenadas hoy:    Referencias ordenadas hoy:  rdenes de referencia No se solicitaron referencias hoy    He ordenado el siguiente medicamento/cambiado los siguientes medicamentos:  Suspender los siguientes medicamentos: Medicamentos descontinuados durante este encuentro [place] [employer] information centre manager . Prenatal Vit-Fe Fumarate-FA (PRENATAL VITAMIN PO) Reordenar . comprimido de famotidina (PEPCID) de 20 mg Volver a pedir    [employer] medicamentos: Meds orden este encuentro medicamentos . Prenatal Vit-Fe Fumarate-FA (VITAMINA PRENATAL) 27-0.8 MG TABS Sig: Tomar 1 comprimido por va oral al da. Dispensar: 90 tabletas recarga: 1 . tableta de 20 mg de famotidina (PEPCID) Sig: Tome 1 tableta (20 mg en total) por va oral antes de acostarse. Dispensar: 90 tabletas recarga: 1    Seguimiento: 3 meses   Recuerda: te llamar con resultados  Si tiene alguna pregunta o inquietud, llame a la clnica de medicina interna al [phone].    Thurmon Fair, MD [facility]  Thank you, [name] for allowing [place] to provide your care today. Today we discussed. 1. Gastroesophageal reflux disease without esophagitis 2. Attempting to conceive 3. Screening for hyperlipidemia      I have ordered the following labs for you:   Lab Orders     BMP8+Anion Gap     CBC with Diff     Pregnancy Test, Serum, Qual     Lipid Profile   Tests ordered today:    Referrals ordered today:   Referral Orders  No referral(s) requested today      I have ordered the following medication/changed the following medications:   Stop the following medications: Medications Discontinued During This Encounter  Medication Reason  . Prenatal Vit-Fe Fumarate-FA (PRENATAL VITAMIN PO) Reorder  . famotidine (PEPCID) 20 MG tablet Reorder     Start the following medications: Meds ordered this encounter  Medications  . Prenatal Vit-Fe Fumarate-FA (PRENATAL VITAMIN) 27-0.8 MG TABS    Sig: Take 1 tablet by mouth daily.    Dispense:  90 tablet    Refill:  1  . famotidine (PEPCID) 20 MG tablet    Sig: Take 1 tablet (20 mg total) by mouth at bedtime.    Dispense:  90 tablet    Refill:  1     Follow up: 3 months    Remember: I will call you with results  Should you have any questions or concerns please call the internal medicine clinic at [phone].      Thurmon Fair, M.D. [facility]

## 2020-12-08 NOTE — Assessment & Plan Note (Signed)
Patient request screening today. BMI 31.   - Lipid Profile

## 2020-12-08 NOTE — Assessment & Plan Note (Signed)
Gastroesophageal reflux disease without esophagitis Patient was started on treatment omeprazole for GERD 11/21 and famotidine added in Jan. On her last visit I recommended stopping omeprazole and continuing famotidine.She continues to have her symptoms controlled on famotidine only.  Refilling prescription today.  - famotidine (PEPCID) 20 MG tablet; Take 1 tablet (20 mg total) by mouth at bedtime.  Dispense: 90 tablet; Refill: 1

## 2020-12-09 LAB — CBC WITH DIFFERENTIAL/PLATELET
Basophils Absolute: 0 10*3/uL (ref 0.0–0.2)
Basos: 0 %
EOS (ABSOLUTE): 0.1 10*3/uL (ref 0.0–0.4)
Eos: 1 %
Hematocrit: 39.9 % (ref 34.0–46.6)
Hemoglobin: 12.6 g/dL (ref 11.1–15.9)
Immature Grans (Abs): 0 10*3/uL (ref 0.0–0.1)
Immature Granulocytes: 0 %
Lymphocytes Absolute: 2.9 10*3/uL (ref 0.7–3.1)
Lymphs: 38 %
MCH: 28.6 pg (ref 26.6–33.0)
MCHC: 31.6 g/dL (ref 31.5–35.7)
MCV: 91 fL (ref 79–97)
Monocytes Absolute: 0.8 10*3/uL (ref 0.1–0.9)
Monocytes: 10 %
Neutrophils Absolute: 3.8 10*3/uL (ref 1.4–7.0)
Neutrophils: 51 %
Platelets: 197 10*3/uL (ref 150–450)
RBC: 4.4 x10E6/uL (ref 3.77–5.28)
RDW: 12.9 % (ref 11.7–15.4)
WBC: 7.7 10*3/uL (ref 3.4–10.8)

## 2020-12-09 LAB — LIPID PANEL
Chol/HDL Ratio: 3.8 ratio (ref 0.0–4.4)
Cholesterol, Total: 174 mg/dL (ref 100–199)
HDL: 46 mg/dL (ref >39)
LDL Chol Calc (NIH): 107 mg/dL — ABNORMAL HIGH (ref 0–99)
Triglycerides: 116 mg/dL (ref 0–149)
VLDL Cholesterol Cal: 21 mg/dL (ref 5–40)

## 2020-12-09 LAB — BMP8+ANION GAP
Anion Gap: 14 mmol/L (ref 10.0–18.0)
BUN/Creatinine Ratio: 19 (ref 9–23)
BUN: 9 mg/dL (ref 6–20)
CO2: 20 mmol/L (ref 20–29)
Calcium: 8.5 mg/dL — ABNORMAL LOW (ref 8.7–10.2)
Chloride: 104 mmol/L (ref 96–106)
Creatinine, Ser: 0.48 mg/dL — ABNORMAL LOW (ref 0.57–1.00)
GFR calc Af Amer: 151 mL/min/{1.73_m2} (ref >59)
GFR calc non Af Amer: 131 mL/min/{1.73_m2} (ref >59)
Glucose: 83 mg/dL (ref 65–99)
Potassium: 4.3 mmol/L (ref 3.5–5.2)
Sodium: 138 mmol/L (ref 134–144)

## 2020-12-09 LAB — HCG, SERUM, QUALITATIVE: hCG,Beta Subunit,Qual,Serum: NEGATIVE m[IU]/mL (ref <6)

## 2020-12-12 NOTE — Progress Notes (Signed)
Internal Medicine Clinic Attending ° °Case discussed with Dr. Steen  at the time of the visit.  We reviewed the resident’s history and exam and pertinent patient test results.  I agree with the assessment, diagnosis, and plan of care documented in the resident’s note.  °

## 2020-12-21 ENCOUNTER — Ambulatory Visit: Payer: Medicaid Other | Admitting: Behavioral Health

## 2020-12-21 DIAGNOSIS — F4320 Adjustment disorder, unspecified: Secondary | ICD-10-CM
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
## 2020-12-24 ENCOUNTER — Ambulatory Visit (INDEPENDENT_AMBULATORY_CARE_PROVIDER_SITE_OTHER): Payer: Medicaid Other

## 2020-12-24 DIAGNOSIS — Z3200 Encounter for pregnancy test, result unknown: Secondary | ICD-10-CM

## 2020-12-24 DIAGNOSIS — Z3201 Encounter for pregnancy test, result positive: Secondary | ICD-10-CM | POA: Diagnosis not present

## 2020-12-24 LAB — POCT PREGNANCY, URINE: Preg Test, Ur: POSITIVE — AB

## 2020-12-24 NOTE — Progress Notes (Signed)
Possible Pregnancy  Here today for possible pregnancy. UPT is positive. Called pt with results with interpreter Raquel. Pt is 5w 3d today.  LMP: 11/16/20 EDD: 08/23/21  OB history: vaginal delivery x 2. Denies any complications with pregnancies.   Pt would like to begin prenatal care with our office, front office notified. Reviewed medication and allergies with pt; pt is already taking a prenatal vitamin. Denies any pain or vaginal bleeding at this time. Pt will follow up as needed until new OB appts.   Fleet Contras RN 12/24/20

## 2020-12-25 NOTE — Progress Notes (Signed)
Chart reviewed for nurse visit. Agree with plan of care.   Elliot Simoneaux M, MD 12/25/20 10:59 AM 

## 2021-01-03 ENCOUNTER — Encounter: Payer: Medicaid Other | Admitting: Student

## 2021-01-04 ENCOUNTER — Encounter: Payer: Self-pay | Admitting: Internal Medicine

## 2021-01-04 ENCOUNTER — Ambulatory Visit (INDEPENDENT_AMBULATORY_CARE_PROVIDER_SITE_OTHER): Payer: Medicaid Other | Admitting: Internal Medicine

## 2021-01-04 VITALS — BP 112/58 | HR 71 | Temp 99.0°F | Wt 170.2 lb

## 2021-01-04 DIAGNOSIS — K219 Gastro-esophageal reflux disease without esophagitis: Secondary | ICD-10-CM | POA: Diagnosis not present

## 2021-01-04 DIAGNOSIS — R1013 Epigastric pain: Secondary | ICD-10-CM | POA: Diagnosis present

## 2021-01-04 NOTE — Patient Instructions (Addendum)
To Sheri Perez,  It was a pleasure to meet you today! Today we talked about your sore throat and stomach. Today, we will have you provide a stool sample for ruling out H. Pylori. Once you provide the stool sample we will be able to begin treatment for your symptoms. Please reach out to staff once the stool samples are provided for your medications. I will have you come back to visit the clinic in 4 weeks.  Have a good day,  Dolan Amen, MD  A [name], [place] un placer conocerte hoy! Hoy hablamos de tu dolor de garganta y [place]. Hoy le pediremos que proporcione una muestra de heces para descartar H. Pylori. Una vez que proporcione la [place] de heces, podremos comenzar el tratamiento de sus sntomas. Comunquese con el personal una vez que se proporcionen las muestras de heces para sus medicamentos. Har que vuelva a visitar la clnica en 4 semanas. Que tenga un buen da, Ezzie Dural, MD

## 2021-01-05 ENCOUNTER — Encounter: Payer: Self-pay | Admitting: Internal Medicine

## 2021-01-05 ENCOUNTER — Other Ambulatory Visit: Payer: Medicaid Other

## 2021-01-05 ENCOUNTER — Other Ambulatory Visit: Payer: Self-pay

## 2021-01-05 NOTE — Progress Notes (Signed)
Internal Medicine Clinic Attending  Case discussed with Dr. Sande Brothers  At the time of the visit.  We reviewed the resident's history and exam and pertinent patient test results.  I agree with the assessment, diagnosis, and plan of care documented in the resident's note.   Patient here with possible atypical / extraesophageal GERD symptoms including throat burning and a globus sensation. She is also complaining of dyspepsia with bloating and indigestion. For her dyspepsia, agree with test and treat approach for H pylori. If negative, would start empiric PPI therapy for both complaints and monitor for improvement.

## 2021-01-05 NOTE — Progress Notes (Signed)
   CC: Indigestion   HPI:  [name] is a 30 y.o. M/F, with a PMH noted below, who presents to the clinic indigestion. To see the management of their acute and chronic conditions, please see the A&P note under the Encounters tab.   Past Medical History:  Diagnosis Date  . GERD (gastroesophageal reflux disease)   . Medical history non-contributory    Review of Systems:   Review of Systems  Constitutional: Negative for chills, diaphoresis, fever, malaise/fatigue and weight loss.  Respiratory: Negative for cough and hemoptysis.   Cardiovascular: Negative for chest pain, palpitations, orthopnea, claudication and leg swelling.  Gastrointestinal: Positive for heartburn. Negative for abdominal pain, blood in stool, constipation, diarrhea, melena, nausea and vomiting.     Physical Exam:  Vitals:   01/04/21 1526  BP: (!) 112/58  Pulse: 71  Temp: 99 F (37.2 C)  TempSrc: Oral  SpO2: 96%  Weight: 170 lb 3.2 oz (77.2 kg)   Physical Exam Constitutional:      General: She is not in acute distress.    Appearance: Normal appearance. She is obese. She is not ill-appearing, toxic-appearing or diaphoretic.  HENT:     Head: Normocephalic and atraumatic.     Mouth/Throat:     Mouth: Mucous membranes are moist.     Pharynx: Oropharynx is clear. No oropharyngeal exudate or posterior oropharyngeal erythema.  Cardiovascular:     Rate and Rhythm: Normal rate and regular rhythm.     Pulses: Normal pulses.     Heart sounds: Normal heart sounds. No murmur heard. No friction rub. No gallop.   Pulmonary:     Effort: Pulmonary effort is normal.     Breath sounds: Normal breath sounds. No wheezing, rhonchi or rales.  Abdominal:     General: Abdomen is flat. Bowel sounds are normal.     Palpations: Abdomen is soft.     Tenderness: There is no abdominal tenderness. There is no guarding.  Neurological:     Mental Status: She is alert and oriented to person, place, and time.   Psychiatric:        Mood and Affect: Mood normal.        Behavior: Behavior normal.      Assessment & Plan:   See Encounters Tab for problem based charting.  Patient discussed with Dr. Antony Contras

## 2021-01-05 NOTE — Assessment & Plan Note (Signed)
-   See dypepsia

## 2021-01-05 NOTE — Assessment & Plan Note (Addendum)
Patient presents with complaints of indigestion with associated globus sensation, and sour/salty taste in her mouth for a week in duration. She states that these feelings are similar to her GERD, previously on omeprazole and famotidine now on famotidine, but are more intense. She is able to tolerate solids and liquids without issue and has no issues with swallowing.  Aggravating factors include lying down at night, and sleeping with multiple pillows alleviates her symptoms. She has not tried anything to alleviate her symptoms because "I will only take something prescribed by my doctor."   She denies nausea, vomiting, fevers, abdominal pain.   A/P:  Patient with history of GERD presents for dyspepsia. Patient is young and recently found out she is recently pregnant with her 3rd child (+ pregnancy test on 12/24/20). She was treated in the past with pepcid and prilosec. At her last visit, her prilosec was DC and she was doing well on pepcid.   She presents today with worsening symptoms and new complaints of indegestion with feelings of bloating and burning sensation of her throat. She has not tried anything to alleviate her symptoms, including protonix or bismuth containing medications. She has not had prior  H. Pylori screening in the past, for her symptoms. While she is recently pregnant, and reflux is common in pregnancy although the mechanism is generally compression of the stomach, and GERD is the most common cause of dyspepsia, this may be an aggravation of her GERD. Since she is not currently on protonix or Bismuth containing projects. Will test for H. Pylor as she is <60 y/o. Once sample is provided we can restart her PPI and trial Pepto Bismol.  - H. Pylori stool Ag container provided, if result is + start eradication therapy - Once sample is collected restart Prilosec 20 mg daily and consider - Continue Pepcid 20 mg nightly

## 2021-01-07 LAB — H. PYLORI ANTIGEN, STOOL: H pylori Ag, Stl: NEGATIVE

## 2021-01-11 ENCOUNTER — Telehealth: Payer: Self-pay

## 2021-01-11 NOTE — Telephone Encounter (Signed)
Requesting test results, please call pt back.  

## 2021-01-12 NOTE — Telephone Encounter (Signed)
Patient calling back. States she is not feeling well; same sx as last week. Has not received test results or been notified if a med will be called in. Relayed to patient that H.Pylori was negative. Please call patient back via Spanish Interpreter [phone]) with next steps.

## 2021-01-13 ENCOUNTER — Other Ambulatory Visit: Payer: Self-pay | Admitting: Internal Medicine

## 2021-01-13 DIAGNOSIS — K219 Gastro-esophageal reflux disease without esophagitis: Secondary | ICD-10-CM

## 2021-01-13 MED ORDER — ALUMINUM & MAGNESIUM HYDROXIDE 200-200 MG/5ML PO SUSP: 5.0000 mL | Freq: Four times a day (QID) | ORAL | 2 refills | Status: DC | PRN

## 2021-01-13 MED ORDER — OMEPRAZOLE 10 MG PO CPDR: 10.0000 mg | DELAYED_RELEASE_CAPSULE | Freq: Every day | ORAL | 2 refills | Status: DC

## 2021-01-13 NOTE — Telephone Encounter (Signed)
Called patient and left voicemail that medication has been sent to the pharmacy per Dr. Sande Brothers.

## 2021-01-13 NOTE — Addendum Note (Signed)
Addended by: Dolan Amen C on: 01/13/2021 10:54 AM   Modules accepted: Orders

## 2021-01-17 ENCOUNTER — Encounter (HOSPITAL_COMMUNITY): Payer: Self-pay | Admitting: Family Medicine

## 2021-01-17 ENCOUNTER — Telehealth: Payer: Self-pay | Admitting: Family Medicine

## 2021-01-17 ENCOUNTER — Inpatient Hospital Stay (HOSPITAL_COMMUNITY): Payer: Medicaid Other

## 2021-01-17 ENCOUNTER — Other Ambulatory Visit: Payer: Self-pay

## 2021-01-17 ENCOUNTER — Inpatient Hospital Stay (HOSPITAL_COMMUNITY)
Admission: AD | Admit: 2021-01-17 | Discharge: 2021-01-17 | Disposition: A | Discharge status: Home / Self Care | Payer: Medicaid Other | Attending: Family Medicine | Admitting: Family Medicine

## 2021-01-17 DIAGNOSIS — O418X10 Other specified disorders of amniotic fluid and membranes, first trimester, not applicable or unspecified: Secondary | ICD-10-CM | POA: Insufficient documentation

## 2021-01-17 DIAGNOSIS — O209 Hemorrhage in early pregnancy, unspecified: Secondary | ICD-10-CM

## 2021-01-17 DIAGNOSIS — Z3A08 8 weeks gestation of pregnancy: Secondary | ICD-10-CM

## 2021-01-17 DIAGNOSIS — Z3491 Encounter for supervision of normal pregnancy, unspecified, first trimester: Secondary | ICD-10-CM

## 2021-01-17 DIAGNOSIS — R109 Unspecified abdominal pain: Secondary | ICD-10-CM | POA: Diagnosis not present

## 2021-01-17 DIAGNOSIS — O4691 Antepartum hemorrhage, unspecified, first trimester: Secondary | ICD-10-CM

## 2021-01-17 DIAGNOSIS — O26891 Other specified pregnancy related conditions, first trimester: Secondary | ICD-10-CM | POA: Diagnosis not present

## 2021-01-17 DIAGNOSIS — O468X1 Other antepartum hemorrhage, first trimester: Secondary | ICD-10-CM

## 2021-01-17 DIAGNOSIS — Z79899 Other long term (current) drug therapy: Secondary | ICD-10-CM | POA: Diagnosis not present

## 2021-01-17 DIAGNOSIS — O208 Other hemorrhage in early pregnancy: Secondary | ICD-10-CM | POA: Diagnosis not present

## 2021-01-17 LAB — URINALYSIS, ROUTINE W REFLEX MICROSCOPIC
Bacteria, UA: NONE SEEN
Bilirubin Urine: NEGATIVE
Glucose, UA: NEGATIVE mg/dL
Ketones, ur: NEGATIVE mg/dL
Leukocytes,Ua: NEGATIVE
Nitrite: NEGATIVE
Protein, ur: NEGATIVE mg/dL
Specific Gravity, Urine: 1.014 (ref 1.005–1.030)
pH: 5 (ref 5.0–8.0)

## 2021-01-17 LAB — CBC
HCT: 40 % (ref 36.0–46.0)
Hemoglobin: 13.1 g/dL (ref 12.0–15.0)
MCH: 30.6 pg (ref 26.0–34.0)
MCHC: 32.8 g/dL (ref 30.0–36.0)
MCV: 93.5 fL (ref 80.0–100.0)
Platelets: 208 10*3/uL (ref 150–400)
RBC: 4.28 MIL/uL (ref 3.87–5.11)
RDW: 15.7 % — ABNORMAL HIGH (ref 11.5–15.5)
WBC: 8.9 10*3/uL (ref 4.0–10.5)
nRBC: 0 % (ref 0.0–0.2)

## 2021-01-17 LAB — WET PREP, GENITAL
Clue Cells Wet Prep HPF POC: NONE SEEN
Sperm: NONE SEEN
Trich, Wet Prep: NONE SEEN
Yeast Wet Prep HPF POC: NONE SEEN

## 2021-01-17 LAB — HCG, QUANTITATIVE, PREGNANCY: hCG, Beta Chain, Quant, S: 101155 m[IU]/mL — ABNORMAL HIGH (ref <5)

## 2021-01-17 NOTE — MAU Note (Signed)
Called her dr, pt is bleeding(started yesterday morning), having pain/pressure in lower abd. One pad yesterday, much lighter today- only when she wipes, has passed one peas sized clot.

## 2021-01-17 NOTE — Telephone Encounter (Signed)
Spanish -pt states she is 8 weeks preg (LMP 11-16-20) and she is cramping and bleeding and needs to speak with someone.

## 2021-01-17 NOTE — Discharge Instructions (Signed)
Prenatal Care Providers           Center for [facility] @ [facility]  [address] [phone]  Center for [facility] @ [facility]   [address]  [phone]  [facility] @ [facility]       [address] [phone]            Center for [facility] @ [facility]     [phone]          Center for [facility] @ [facility]   [address] [phone]  [facility] for [facility] @ [facility]  [address] [phone]     Center for [facility] @ Family [address] [place])  [address]   [phone]     [facility]  Phone: [phone]  [facility]  Phone: [phone]  [facility] Phone: [phone]  [facility] Phone: [phone]  [facility] Phone: [phone]  [facility] Phone: [phone]  [facility]  Phone: [phone]  Safe Medications in Pregnancy   Acne: Benzoyl Peroxide Salicylic Acid  Backache/Headache: Tylenol: 2 regular strength every 4 hours OR              2 Extra strength every 6 hours  Colds/Coughs/Allergies: Benadryl (alcohol free) 25 mg every 6 hours as needed Breath right strips Claritin Cepacol throat lozenges Chloraseptic throat spray Cold-Eeze- up to three times per day Cough drops, alcohol free Flonase (by prescription only) Guaifenesin Mucinex Robitussin DM (plain only, alcohol free) Saline nasal spray/drops Sudafed (pseudoephedrine) & Actifed ** use only after [redacted] weeks gestation and if you do not have high blood pressure Tylenol Vicks Vaporub Zinc lozenges Zyrtec   Constipation: Colace Ducolax suppositories Fleet enema Glycerin suppositories Metamucil Milk of magnesia Miralax Senokot Smooth move tea  Diarrhea: Kaopectate Imodium A-D  *NO pepto  Bismol  Hemorrhoids: Anusol Anusol HC Preparation H Tucks  Indigestion: Tums Maalox Mylanta Zantac  Pepcid  Insomnia: Benadryl (alcohol free) 25mg  every 6 hours as needed Tylenol PM Unisom, no Gelcaps  Leg Cramps: Tums MagGel  Nausea/Vomiting:  Bonine Dramamine Emetrol Ginger extract Sea bands Meclizine  Nausea medication to take during pregnancy:  Unisom (doxylamine succinate 25 mg tablets) Take one tablet daily at bedtime. If symptoms are not adequately controlled, the dose can be increased to a maximum recommended dose of two tablets daily (1/2 tablet in the morning, 1/2 tablet mid-afternoon and one at bedtime). Vitamin B6 100mg  tablets. Take one tablet twice a day (up to 200 mg per day).  Skin Rashes: Aveeno products Benadryl cream or 25mg  every 6 hours as needed Calamine Lotion 1% cortisone cream  Yeast infection: Gyne-lotrimin 7 Monistat 7   **If taking multiple medications, please check labels to avoid duplicating the same active ingredients **take medication as directed on the label ** Do not exceed 4000 mg of tylenol in 24 hours **Do not take medications that contain aspirin or ibuprofen     Obstetrics: Normal and Problem Pregnancies (7th ed., pp. 102-121). [place], [place]: Elsevier."> Textbook of Family Medicine (9th ed., pp. [phone]). [place], [place]: Elsevier [employer].">  First Trimester of Pregnancy  The first trimester of pregnancy starts on the first day of your last menstrual period until the end of week 12. This is months 1 through 3 of pregnancy. A week after a sperm fertilizes an egg, the egg will  implant into the wall of the uterus and begin to develop into a baby. By the end of 12 weeks, all the baby's organs will be formed and the baby will be 2-3 inches in size. Body changes during your first trimester Your body goes through many changes during pregnancy. The changes vary and generally return to normal after your baby is  born. Physical changes  You may gain or lose weight.  Your breasts may begin to grow larger and become tender. The tissue that surrounds your nipples (areola) may become darker.  Dark spots or blotches (chloasma or mask of pregnancy) may develop on your face.  You may have changes in your hair. These can include thickening or thinning of your hair or changes in texture. Health changes  You may feel nauseous, and you may vomit.  You may have heartburn.  You may develop headaches.  You may develop constipation.  Your gums may bleed and may be sensitive to brushing and flossing. Other changes  You may tire easily.  You may urinate more often.  Your menstrual periods will stop.  You may have a loss of appetite.  You may develop cravings for certain kinds of food.  You may have changes in your emotions from day to day.  You may have more vivid and strange dreams. Follow these instructions at home: Medicines  Follow your health care provider's instructions regarding medicine use. Specific medicines may be either safe or unsafe to take during pregnancy. Do not take any medicines unless told to by your health care provider.  Take a prenatal vitamin that contains at least 600 micrograms (mcg) of folic acid. Eating and drinking  Eat a healthy diet that includes fresh fruits and vegetables, whole grains, good sources of protein such as meat, eggs, or tofu, and low-fat dairy products.  Avoid raw meat and unpasteurized juice, milk, and cheese. These carry germs that can harm you and your baby.  If you feel nauseous or you vomit: ? Eat 4 or 5 small meals a day instead of 3 large meals. ? Try eating a few soda crackers. ? Drink liquids between meals instead of during meals.  You may need to take these actions to prevent or treat constipation: ? Drink enough fluid to keep your urine pale yellow. ? Eat foods that are high in fiber, such as beans, whole grains, and fresh fruits  and vegetables. ? Limit foods that are high in fat and processed sugars, such as fried or sweet foods. Activity  Exercise only as directed by your health care provider. Most people can continue their usual exercise routine during pregnancy. Try to exercise for 30 minutes at least 5 days a week.  Stop exercising if you develop pain or cramping in the lower abdomen or lower back.  Avoid exercising if it is very hot or humid or if you are at high altitude.  Avoid heavy lifting.  If you choose to, you may have sex unless your health care provider tells you not to. Relieving pain and discomfort  Wear a good support bra to relieve breast tenderness.  Rest with your legs elevated if you have leg cramps or low back pain.  If you develop bulging veins (varicose veins) in your legs: ? Wear support hose as told by your health care provider. ? Elevate your feet for 15 minutes, 3-4 times a day. ? Limit salt in your diet. Safety  Wear your seat belt at all times when driving or riding in  a car.  Talk with your health care provider if someone is verbally or physically abusive to you.  Talk with your health care provider if you are feeling sad or have thoughts of hurting yourself. Lifestyle  Do not use hot tubs, steam rooms, or saunas.  Do not douche. Do not use tampons or scented sanitary pads.  Do not use herbal remedies, alcohol, illegal drugs, or medicines that are not approved by your health care provider. Chemicals in these products can harm your baby.  Do not use any products that contain nicotine or tobacco, such as cigarettes, e-cigarettes, and chewing tobacco. If you need help quitting, ask your health care provider.  Avoid cat litter boxes and soil used by cats. These carry germs that can cause birth defects in the baby and possibly loss of the unborn baby (fetus) by miscarriage or stillbirth. General instructions  During routine prenatal visits in the first trimester, your  health care provider will do a physical exam, perform necessary tests, and ask you how things are going. Keep all follow-up visits. This is important.  Ask for help if you have counseling or nutritional needs during pregnancy. Your health care provider can offer advice or refer you to specialists for help with various needs.  Schedule a dentist appointment. At home, brush your teeth with a soft toothbrush. Floss gently.  Write down your questions. Take them to your prenatal visits. Where to find more information  American Pregnancy Association: americanpregnancy.org  [employer] of Obstetricians and Gynecologists: https://www.todd-brady.net/  Office on [employer] Health: Mighty[employer]ard.[employer].nz Contact a health care provider if you have:  Dizziness.  A fever.  Mild pelvic cramps, pelvic pressure, or nagging pain in the abdominal area.  Nausea, vomiting, or diarrhea that lasts for 24 hours or longer.  A bad-smelling vaginal discharge.  Pain when you urinate.  Known exposure to a contagious illness, such as chickenpox, measles, Zika virus, HIV, or hepatitis. Get help right away if you have:  Spotting or bleeding from your vagina.  Severe abdominal cramping or pain.  Shortness of breath or chest pain.  Any kind of trauma, such as from a fall or a car crash.  New or increased pain, swelling, or redness in an arm or leg. Summary  The first trimester of pregnancy starts on the first day of your last menstrual period until the end of week 12 (months 1 through 3).  Eating 4 or 5 small meals a day rather than 3 large meals may help to relieve nausea and vomiting.  Do not use any products that contain nicotine or tobacco, such as cigarettes, e-cigarettes, and chewing tobacco. If you need help quitting, ask your health care provider.  Keep all follow-up visits. This is important. This information is not intended to replace advice given to you by your health care  provider. Make sure you discuss any questions you have with your health care provider. Document Revised: 03/10/2020 Document Reviewed: 01/15/2020 Elsevier Patient Education  2021 [employer].

## 2021-01-17 NOTE — MAU Provider Note (Signed)
History     CSN: [id number]  Arrival date and time: 01/17/21 1511   Chief Complaint  Patient presents with  . Abdominal Pain  . Vaginal Bleeding   HPI Sheri Perez is a 30 y.o. G3P2002 at [redacted]w[redacted]d who presents with vaginal bleeding. She states she had a small amount of bleeding yesterday and today it has been spotting when she wipes. She also reports intermittent cramping that she rates a 4/10 and has not tried anything for the pain. She plans to get care at [facility] but has not been seen anywhere this pregnancy.   OB History    Gravida  3   Para  2   Term  2   Preterm  0   AB  0   Living  2     SAB  0   IAB  0   Ectopic  0   Multiple  0   Live Births  2           Past Medical History:  Diagnosis Date  . GERD (gastroesophageal reflux disease)   . Medical history non-contributory     Past Surgical History:  Procedure Laterality Date  . NO PAST SURGERIES      Family History  Problem Relation Age of Onset  . Diabetes Father   . Hypertension Father     Social History   Tobacco Use  . Smoking status: Never Smoker  . Smokeless tobacco: Never Used  Substance Use Topics  . Alcohol use: No    Alcohol/week: 0.0 standard drinks  . Drug use: No    Allergies: No Known Allergies  Medications Prior to Admission  Medication Sig Dispense Refill Last Dose  . aluminum-magnesium hydroxide 200-200 MG/5ML suspension Take 5 mLs by mouth every 6 (six) hours as needed for indigestion. 355 mL 2   . omeprazole (PRILOSEC) 10 MG capsule Take 1 capsule (10 mg total) by mouth daily before breakfast. 30 capsule 2   . Prenatal Vit-Fe Fumarate-FA (PRENATAL VITAMIN) 27-0.8 MG TABS Take 1 tablet by mouth daily. 90 tablet 1     Review of Systems  Constitutional: Negative.  Negative for fatigue and fever.  HENT: Negative.   Respiratory: Negative.  Negative for shortness of breath.   Cardiovascular: Negative.  Negative for chest pain.  Gastrointestinal: Positive for  abdominal pain. Negative for constipation, diarrhea, nausea and vomiting.  Genitourinary: Positive for vaginal bleeding. Negative for dysuria.  Neurological: Negative.  Negative for dizziness and headaches.   Physical Exam   Blood pressure 126/63, pulse 82, temperature 98.5 F (36.9 C), temperature source Oral, resp. rate 16, height 5\' 2"  (1.575 m), weight 77.6 kg, last menstrual period 11/16/2020, SpO2 99 %, currently breastfeeding.  Physical Exam Vitals and nursing note reviewed.  Constitutional:      General: She is not in acute distress.    Appearance: She is well-developed.  HENT:     Head: Normocephalic.  Eyes:     Pupils: Pupils are equal, round, and reactive to light.  Cardiovascular:     Rate and Rhythm: Normal rate and regular rhythm.     Heart sounds: Normal heart sounds.  Pulmonary:     Effort: Pulmonary effort is normal. No respiratory distress.     Breath sounds: Normal breath sounds.  Abdominal:     General: Bowel sounds are normal. There is no distension.     Palpations: Abdomen is soft.     Tenderness: There is no abdominal tenderness.  Genitourinary:  Comments: Pelvic exam: Cervix pink, visually closed, without lesion, scant white creamy discharge, vaginal walls and external genitalia normal Bimanual exam: Cervix 0/long/high, firm, anterior, neg CMT, uterus nontender, adnexa without tenderness, enlargement, or mass  Skin:    General: Skin is warm and dry.  Neurological:     Mental Status: She is alert and oriented to person, place, and time.  Psychiatric:        Behavior: Behavior normal.        Thought Content: Thought content normal.        Judgment: Judgment normal.     MAU Course  Procedures Results for orders placed or performed during the hospital encounter of 01/17/21 (from the past 24 hour(s))  CBC     Status: Abnormal   Collection Time: 01/17/21  3:32 PM  Result Value Ref Range   WBC 8.9 4.0 - 10.5 K/uL   RBC 4.28 3.87 - 5.11 MIL/uL    Hemoglobin 13.1 12.0 - 15.0 g/dL   HCT 16.3 84.6 - 65.9 %   MCV 93.5 80.0 - 100.0 fL   MCH 30.6 26.0 - 34.0 pg   MCHC 32.8 30.0 - 36.0 g/dL   RDW 93.5 (H) 70.1 - 77.9 %   Platelets 208 150 - 400 K/uL   nRBC 0.0 0.0 - 0.2 %  hCG, quantitative, pregnancy     Status: Abnormal   Collection Time: 01/17/21  3:32 PM  Result Value Ref Range   hCG, Beta Chain, Quant, S 101,155 (H) <5 mIU/mL  Urinalysis, Routine w reflex microscopic Urine, Clean Catch     Status: Abnormal   Collection Time: 01/17/21  4:00 PM  Result Value Ref Range   Color, Urine YELLOW YELLOW   APPearance CLEAR CLEAR   Specific Gravity, Urine 1.014 1.005 - 1.030   pH 5.0 5.0 - 8.0   Glucose, UA NEGATIVE NEGATIVE mg/dL   Hgb urine dipstick SMALL (A) NEGATIVE   Bilirubin Urine NEGATIVE NEGATIVE   Ketones, ur NEGATIVE NEGATIVE mg/dL   Protein, ur NEGATIVE NEGATIVE mg/dL   Nitrite NEGATIVE NEGATIVE   Leukocytes,Ua NEGATIVE NEGATIVE   RBC / HPF 0-5 0 - 5 RBC/hpf   WBC, UA 0-5 0 - 5 WBC/hpf   Bacteria, UA NONE SEEN NONE SEEN   Squamous Epithelial / LPF 0-5 0 - 5   Mucus PRESENT   Wet prep, genital     Status: Abnormal   Collection Time: 01/17/21  4:14 PM  Result Value Ref Range   Yeast Wet Prep HPF POC NONE SEEN NONE SEEN   Trich, Wet Prep NONE SEEN NONE SEEN   Clue Cells Wet Prep HPF POC NONE SEEN NONE SEEN   WBC, Wet Prep HPF POC MODERATE (A) NONE SEEN   Sperm NONE SEEN    US OB LESS THAN 14 WEEKS WITH OB TRANSVAGINAL  Result Date: 01/17/2021 CLINICAL DATA:  Bleeding after pregnancy. Beta HCG 101, 155. Gestational age by last menstrual period 8 weeks and 6 days. Last menstrual period 11/16/2020. Estimated due date by last menstrual period 08/23/2021. EXAM: OBSTETRIC <14 WK [place] AND TRANSVAGINAL OB US TECHNIQUE: Both transabdominal and transvaginal ultrasound examinations were performed for complete evaluation of the gestation as well as the maternal uterus, adnexal regions, and pelvic cul-de-sac. Transvaginal technique was  performed to assess early pregnancy. COMPARISON:  None. FINDINGS: Intrauterine gestational sac: Single Yolk sac:  Visualized. Embryo:  Visualized. Cardiac Activity: Visualized. Heart Rate: 165 bpm CRL:  1.62 mm   8 w   0 d  [place] EDC: 08/29/2021 Subchorionic hemorrhage:  Small. Maternal uterus/adnexae: The uterus is otherwise unremarkable. Bilateral ovaries are unremarkable. Other: No free pelvic fluid. IMPRESSION: 1. Single live intrauterine pregnancy with a gestational age by ultrasound of 8 weeks and 0 days that is concordant with a gestational age by last menstrual period of 8 weeks and 6 days. 2. Small subchorionic hemorrhage. Electronically Signed   By: Tish Frederickson M.D.   On: 01/17/2021 17:13   MDM UA, UPT CBC, HCG ABO/Rh- O Pos Wet prep and gc/chlamydia US OB Comp Less 14 weeks with Transvaginal  Reviewed results of subchorionic hemorrhage with patient and partner. Discussed that this is a common finding in the first trimester and does not usually cause problems in the pregnancy like loss or difficulty with development. Reviewed expectations for vaginal bleeding including a small amount possibly for several weeks. Reviewed warning signs of heavy bleeding, saturating a pad in less than an hour, and severe pain as reasons to come back to MAU. Encouraged patient to exercise pelvic rest until 7 days after bleeding stops. Patient and support person verbalized understanding.   Assessment and Plan   1. Normal intrauterine pregnancy on prenatal ultrasound in first trimester   2. Vaginal bleeding affecting early pregnancy   3. [redacted] weeks gestation of pregnancy   4. Subchorionic hemorrhage of placenta in first trimester, single or unspecified fetus    -Discharge home in stable condition -First trimester precautions discussed -Patient advised to follow-up with OB as scheduled for prenatal care -Patient may return to MAU as needed or if her condition were to change or worsen  Rolm Bookbinder CNM 01/17/2021, 4:47 PM

## 2021-01-17 NOTE — Telephone Encounter (Signed)
Call returned to pt w/interpreter # [phone]. She stated that she had some bleeding yesterday which was lighter than a period however was enough to fully saturate a sanitary pad. She also passed a small clot the size of a bean or piece of corn. Today, the bleeding is lighter. Pt states she had cramps in her belly yesterday and still has them today. In addition, pt reports pain w/urination. She stated that she called the [facility] hospital and spoke with a nurse who told her that she should call our office and needed to be seen in office today. Per chart review, pt has not yet initiated prenatal care in office. She has appt for New Ob intake interview w/nurse on 4/13 and first provider appt on 4/27. I advised pt that she may have 2 possible problems. Due to the bleeding and cramping, she may be having a miscarriage. It is also possible that she has a bladder infection. Because of the possibility of miscarriage, she will need to go to the hospital for evaluation. While she is there, a test can be performed to check for bladder infection. Pt voiced understanding of recommendations and agreed to go to [facility] for evaluation.

## 2021-01-18 LAB — GC/CHLAMYDIA PROBE AMP (~~LOC~~) NOT AT ARMC
Chlamydia: NEGATIVE
Comment: NEGATIVE
Comment: NORMAL
Neisseria Gonorrhea: NEGATIVE

## 2021-01-26 ENCOUNTER — Ambulatory Visit (INDEPENDENT_AMBULATORY_CARE_PROVIDER_SITE_OTHER): Payer: Medicaid Other | Admitting: *Deleted

## 2021-01-26 ENCOUNTER — Other Ambulatory Visit: Payer: Self-pay

## 2021-01-26 ENCOUNTER — Encounter: Payer: Self-pay | Admitting: *Deleted

## 2021-01-26 VITALS — BP 116/83 | HR 92 | Wt 168.4 lb

## 2021-01-26 DIAGNOSIS — O219 Vomiting of pregnancy, unspecified: Secondary | ICD-10-CM

## 2021-01-26 DIAGNOSIS — Z603 Acculturation difficulty: Secondary | ICD-10-CM

## 2021-01-26 DIAGNOSIS — Z789 Other specified health status: Secondary | ICD-10-CM

## 2021-01-26 DIAGNOSIS — Z3490 Encounter for supervision of normal pregnancy, unspecified, unspecified trimester: Secondary | ICD-10-CM | POA: Insufficient documentation

## 2021-01-26 DIAGNOSIS — O0990 Supervision of high risk pregnancy, unspecified, unspecified trimester: Secondary | ICD-10-CM | POA: Insufficient documentation

## 2021-01-26 DIAGNOSIS — M5489 Other dorsalgia: Secondary | ICD-10-CM

## 2021-01-26 DIAGNOSIS — M549 Dorsalgia, unspecified: Secondary | ICD-10-CM | POA: Insufficient documentation

## 2021-01-26 MED ORDER — PROMETHAZINE HCL 25 MG PO TABS: 25.0000 mg | ORAL_TABLET | Freq: Four times a day (QID) | ORAL | 0 refills | Status: DC | PRN

## 2021-01-26 MED ORDER — BLOOD PRESSURE KIT DEVI: 1.0000 | 0 refills | Status: DC | PRN
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
## 2021-01-26 NOTE — Patient Instructions (Signed)
-   At our [facility], we work as an integrated team, providing care to address both physical and emotional health. Your medical provider may refer you to see our Behavioral Health Clinician (BHC) on the same day you see your medical provider, as availability permits; often scheduled virtually at your convenience.  Our BHC is available to all patients, visits generally last between 20-30 minutes, but can be longer or shorter, depending on patient need. The BHC offers help with stress management, coping with symptoms of depression and anxiety, major life changes , sleep issues, changing risky behavior, grief and loss, life stress, working on personal life goals, and  behavioral health issues, as these all affect your overall health and wellness.  The BHC is NOT available for the following: FMLA paperwork, court-ordered evaluations, specialty assessments (custody or disability), letters to employers, or obtaining certification for an emotional support animal. The BHC does not provide long-term therapy. You have the right to refuse integrated behavioral health services, or to reschedule to see the BHC at a later date.  Confidentiality exception: If it is suspected that a child or disabled adult is being abused or neglected, we are required by law to report that to either Child Protective Services or Adult Protective Services.  If you have a diagnosis of Bipolar affective disorder, Schizophrenia, or recurrent Major depressive disorder, we will recommend that you establish care with a psychiatrist, as these are lifelong, chronic conditions, and we want your overall emotional health and medications to be more closely monitored. If you anticipate needing extended maternity leave due to mental health issues postpartum, it it recommended you inform your medical provider, so we can put in a referral to a  psychiatrist as soon as possible. The BHC is unable to recommend an extended maternity leave for mental  health issues. Your medical provider or BHC may refer you to a therapist for ongoing, traditional therapy, or to a psychiatrist, for medication management, if it would benefit your overall health. Depending on your insurance, you may have a copay to see the BHC. If you are uninsured, it is recommended that you apply for financial assistance. (Forms may be requested at the front desk for in-person visits, via MyChart, or request a form during a virtual visit).  If you see the BHC more than 6 times, you will have to complete a comprehensive clinical assessment interview with the BHC to resume integrated services.  For virtual visits with the BHC, you must be physically in the state of [place] at the time of the visit. For example, if you live in [place], you will have to do an in-person visit with the BHC, and your out-of-state insurance may not cover behavioral health services in [place]. f you are going out of the state or country for any reason, the BHC may see you virtually when you return to [place], but not while you are physically outside of [place].   

## 2021-01-27 LAB — CBC/D/PLT+RPR+RH+ABO+RUB AB...
Antibody Screen: NEGATIVE
Basophils Absolute: 0 10*3/uL (ref 0.0–0.2)
Basos: 0 %
EOS (ABSOLUTE): 0.1 10*3/uL (ref 0.0–0.4)
Eos: 1 %
HCV Ab: <0.1 s/co ratio (ref 0.0–0.9)
HIV Screen 4th Generation wRfx: NONREACTIVE
Hematocrit: 42.1 % (ref 34.0–46.6)
Hemoglobin: 14.1 g/dL (ref 11.1–15.9)
Hepatitis B Surface Ag: NEGATIVE
Immature Grans (Abs): 0.1 10*3/uL (ref 0.0–0.1)
Immature Granulocytes: 1 %
Lymphocytes Absolute: 2.7 10*3/uL (ref 0.7–3.1)
Lymphs: 29 %
MCH: 30.9 pg (ref 26.6–33.0)
MCHC: 33.5 g/dL (ref 31.5–35.7)
MCV: 92 fL (ref 79–97)
Monocytes Absolute: 1.2 10*3/uL — ABNORMAL HIGH (ref 0.1–0.9)
Monocytes: 13 %
Neutrophils Absolute: 5.2 10*3/uL (ref 1.4–7.0)
Neutrophils: 56 %
Platelets: 224 10*3/uL (ref 150–450)
RBC: 4.56 x10E6/uL (ref 3.77–5.28)
RDW: 14.8 % (ref 11.7–15.4)
RPR Ser Ql: NONREACTIVE
Rh Factor: POSITIVE
Rubella Antibodies, IGG: 4.31 index (ref >0.99)
WBC: 9.3 10*3/uL (ref 3.4–10.8)

## 2021-01-27 LAB — HEMOGLOBIN A1C
Est. average glucose Bld gHb Est-mCnc: 114 mg/dL
Hgb A1c MFr Bld: 5.6 % (ref 4.8–5.6)

## 2021-01-27 LAB — HCV INTERPRETATION

## 2021-01-28 LAB — CULTURE, OB URINE

## 2021-01-28 LAB — URINE CULTURE, OB REFLEX

## 2021-02-09 ENCOUNTER — Encounter: Payer: Self-pay | Admitting: Family Medicine

## 2021-02-09 ENCOUNTER — Other Ambulatory Visit (HOSPITAL_COMMUNITY)
Admission: RE | Admit: 2021-02-09 | Discharge: 2021-02-09 | Disposition: A | Discharge status: Home / Self Care | Payer: Medicaid Other | Source: Ambulatory Visit | Attending: Family Medicine | Admitting: Family Medicine

## 2021-02-09 ENCOUNTER — Ambulatory Visit (INDEPENDENT_AMBULATORY_CARE_PROVIDER_SITE_OTHER): Payer: Medicaid Other | Admitting: Family Medicine

## 2021-02-09 VITALS — BP 128/83 | HR 76 | Wt 169.0 lb

## 2021-02-09 DIAGNOSIS — R0989 Other specified symptoms and signs involving the circulatory and respiratory systems: Secondary | ICD-10-CM

## 2021-02-09 DIAGNOSIS — Z3490 Encounter for supervision of normal pregnancy, unspecified, unspecified trimester: Secondary | ICD-10-CM | POA: Diagnosis not present

## 2021-02-09 DIAGNOSIS — Z124 Encounter for screening for malignant neoplasm of cervix: Secondary | ICD-10-CM | POA: Diagnosis not present

## 2021-02-09 DIAGNOSIS — R198 Other specified symptoms and signs involving the digestive system and abdomen: Secondary | ICD-10-CM | POA: Diagnosis not present

## 2021-02-09 DIAGNOSIS — Z789 Other specified health status: Secondary | ICD-10-CM

## 2021-02-09 MED ORDER — FLUTICASONE PROPIONATE 50 MCG/ACT NA SUSP: 1.0000 | Freq: Every day | NASAL | 2 refills | Status: DC

## 2021-02-09 MED ORDER — LORATADINE 10 MG PO TABS: 10.0000 mg | ORAL_TABLET | Freq: Every day | ORAL | 11 refills | Status: DC

## 2021-02-09 NOTE — Progress Notes (Signed)
Patient completed Medicaid Home Form

## 2021-02-09 NOTE — Patient Instructions (Signed)
Obstetrics: Normal and Problem Pregnancies (7th ed., pp. 102-121). [place], [place]: Elsevier."> Textbook of Family Medicine (9th ed., pp. [id number]). [place]: Restaurant manager, fast food trimestre de Psychiatrist First Trimester of Pregnancy  El primer trimestre de Community education officer de su ltimo periodo menstrual hasta el final de la semana 12. Es Designer, jewellery desde el mes 1 hasta el mes 3 de [place]. Una semana despus de que un espermatozoide fecunda un vulo, este se implantar en la pared uterina y comenzar a desarrollarse hasta convertirse en un beb. Al final de las 12 [address], se formarn todos los rganos del beb y el beb tendr un tamao de [place] 2 y 3 pulgadas. Durante Financial risk analyst trimestre, ocurren cambios en el cuerpo Su organismo atraviesa por muchos cambios durante el [place]. Los cambios varan y generalmente vuelven a la normalidad despus del nacimiento del beb. Cambios fsicos  Usted puede aumentar o bajar de [place].  Las [employer]ra Foods pueden empezar a Government social research officer y Emergency planning/management officer. El tejido que rodea los pezones (arola) puede tornarse ms oscuro.  Pueden aparecer zonas oscuras o manchas (cloasma o mscara del embarazo) en el rostro.  Tal vez haya cambios en el cabello. Estos pueden incluir engrosamiento, afinamiento y [employer] Waste Industries textura. Cambios en la salud  Quizs sienta nuseas y es posible que vomite.  Es posible que tenga [place] estomacal.  Comienza a tener dolores de [place].  Puede tener estreimiento.  Las Veterinary surgeon y estar sensibles al cepillado y al hilo dental. Otros cambios  Puede cansarse con facilidad.  Puede orinar con mayor frecuencia.  Los perodos menstruales se interrumpirn.  Tal vez no tenga apetito.  Puede sentir un fuerte deseo de consumir ciertos alimentos.  Puede tener cambios en sus emociones de un da para Therapist, art.  Tendr sueos ms vvidos y extraos. Siga estas instrucciones en su  casa: Medicamentos  Siga las instrucciones del mdico en relacin con el uso de medicamentos. Durante el embarazo, hay medicamentos que pueden tomarse y otros que no. No use ningn medicamento a menos que se los haya indicado el mdico.  Tome vitaminas prenatales que contengan por lo menos (mcg) de cido flico. Comida y bebida  Lleve una dieta saludable que incluya frutas y verduras frescas, cereales integrales, buenas fuentes de protenas como carnes [place], huevos o tofu, y productos lcteos descremados.  Evite la carne cruda y el [place], la [place] y el queso sin Market researcher. Estos portan grmenes que pueden provocar dao tanto a usted como al beb.  Siente nuseas o vomita: ? Ingiera 4 o 5comidas pequeas por Geophysical data processor de 3abundantes. ? Intente comer algunas galletitas saladas. ? Beba lquidos Altria Group, en lugar de [employer].  Es posible que tenga que tomar estas medidas para prevenir o tratar el estreimiento: ? Product manager suficiente lquido como para Pharmacologist la orina de color amarillo plido. ? Consumir alimentos ricos en fibra, como frijoles, cereales integrales, y frutas y verduras frescas. ? Limitar el consumo de alimentos ricos en grasa y azcares procesados, como los alimentos fritos o dulces. Actividad  Haga ejercicio solamente como se lo haya indicado el mdico. La mayora de las personas pueden continuar su rutina de ejercicios durante el [place]. Intente realizar como mnimo de actividad fsica por lo menos 5das a la semana.  Deje de hacer ejercicio si le aparecen dolor o clicos en la parte baja del vientre o de la espalda.  Evite hacer ejercicio si hace mucho calor o humedad,  o si se encuentra a una altitud elevada.  Evite levantar pesos [employer] Brands.  Si lo desea, puede seguir teniendo [employer], salvo que el mdico le indique lo contrario. Alivio del dolor y del Dentist  Use un sostn que le brinde buen  soporte para Engineer, materials de [place].  Descanse con las piernas elevadas si tiene calambres o dolor de cintura.  Si desarrolla venas abultadas (vrices) en las piernas: ? Use medias de compresin como se lo haya indicado el mdico. ? Eleve los pies durante , 3 o 4veces por da. ? Limite el consumo de sal en su dieta. Seguridad  Use el cinturn de seguridad en todo momento mientras conduce o va en auto.  Hable con el mdico si es vctima de [employer] o fsico.  Hable con el mdico si se siente triste o tiene pensamientos acerca de [place] dao a usted misma. Estilo de vida  No se d baos de inmersin en agua caliente, baos turcos ni saunas.  No se haga duchas vaginales. No use tampones ni toallas higinicas perfumadas.  No use remedios a base de hierbas, alcohol, drogas ilegales ni medicamentos que no estn aprobados por el mdico. Las sustancias qumicas de estos productos pueden daar al beb.  No consuma ningn producto que contenga nicotina o tabaco, como cigarrillos, cigarrillos electrnicos y tabaco de Theatre manager. Si necesita ayuda para dejar de fumar, consulte al mdico.  Evite el contacto con las bandejas sanitarias de los gatos y la tierra que estos animales usan. Estos elementos contienen bacterias que pueden causar defectos congnitos al beb y la posible prdida del beb en gestacin (feto) debido a un aborto espontneo o muerte fetal. Instrucciones generales  Durante las visitas prenatales de rutina del Engineer, maintenance trimestre, el mdico le har un examen fsico, Education officer, environmental las pruebas necesarias y le preguntar cmo Merchant navy officer las cosas. Cumpla con todas las visitas de seguimiento. Esto es importante.  Pida ayuda si tiene necesidades nutricionales o de asesoramiento Academic librarian. El mdico puede aconsejarla o derivarla a especialistas para que la ayuden con diferentes necesidades.  Programe una cita con el dentista. En su casa, lvese los dientes con un cepillo  dental suave. Psese el hilo dental suavemente.  Escriba sus preguntas. Llvelas cuando concurra a las visitas prenatales. Dnde buscar ms informacin  American Pregnancy Association (Asociacin Americana del Embarazo): americanpregnancy.org  Celanese Corporation of Obstetricians and Gynecologists (Colegio Estadounidense de Obstetras y [place]): EmploymentAssurance.cz?  Office on Pitney Bowes (Oficina para la Salud de la Mujer): [email or web address] Comunquese con un mdico si tiene:  Research scientist (life sciences).  Grant Ruts.  Clicos leves, presin en la pelvis o dolor persistente en el abdomen.  Nuseas, vmitos o diarrea que duran 24 horas o ms.  Una secrecin vaginal con mal olor.  Dolor al Beatrix Shipper.  Una enfermedad contagiosa, como varicela, sarampin, virus de [place], VIH o hepatitis. Solicite ayuda inmediatamente si:  Tiene manchado o sangrado de la vagina.  Tiene dolor intenso o clicos en el abdomen.  Siente que le falta el aire o dolor en el pecho.  Sufre cualquier tipo de traumatismo, por ejemplo, debido a una cada o un accidente automovilstico.  Dolor, hinchazn o enrojecimiento nuevos en un brazo o una pierna, o un aumento de alguno de estos sntomas. Resumen  Financial risk analyst trimestre del Community education officer de su ltimo periodo menstrual hasta el final de la semana 12 (meses 1 al 3).  Hacer 4 o 5 comidas pequeas al Geophysical data processor de 3 comidas grandes  tambin [employer] nuseas y los vmitos.  No consuma ningn producto que contenga nicotina o tabaco, como cigarrillos, cigarrillos electrnicos y tabaco de Theatre manager. Si necesita ayuda para dejar de fumar, consulte al mdico.  Cumpla con todas las visitas de seguimiento. Esto es importante. Esta informacin no tiene Theme park manager el consejo del mdico. Asegrese de hacerle al mdico cualquier pregunta que tenga. Document Revised: 04/05/2020 Document Reviewed: 04/05/2020 Elsevier Patient Education   2021 Elsevier Inc.   [place] trimestre de Psychiatrist Second Trimester of Pregnancy  El segundo trimestre de embarazo va desde la semana 13 hasta la semana 27. Es Designer, jewellery desde el mes 4 hasta el mes 6 de [place]. El segundo trimestre suele ser el momento en el que mejor se siente. Su organismo se ha adaptado a Charity fundraiser, y comienza a Diplomatic Services operational officer. Durante el segundo trimestre:  Las nuseas del embarazo han disminuido o han desaparecido completamente.  Usted puede tener ms energa.  Es posible que tenga un aumento del apetito. El segundo trimestre es tambin un perodo en el que el beb en gestacin (feto) crece rpidamente. Hacia el final del sexto mes, el feto puede medir aproximadamente 12 pulgadas y pesar alrededor de 1 libras. Es probable que sienta que el beb se Teacher, English as a foreign language (da pataditas) entre las 16 y 20semanas del Psychiatrist. Cambios en el cuerpo durante el segundo trimestre Su cuerpo continua experimentando numerosos cambios durante su segundo trimestre. Los cambios varan y generalmente vuelven a la normalidad despus del nacimiento del beb. Cambios fsicos  Seguir American Standard Companies. Notar que la parte baja del abdomen sobresale.  Podrn aparecer las primeras Albertson's caderas, el abdomen y las [place].  Las ConAgra Foods seguirn creciendo y se tornarn sensibles.  Pueden aparecer zonas oscuras o manchas (cloasma o mscara del embarazo) en el rostro.  Es posible que se forme una lnea oscura desde el ombligo hasta la zona del pubis (linea nigra).  Tal vez haya cambios en el cabello. Esto cambios pueden incluir su engrosamiento, crecimiento rpido y Allied Waste Industries textura. A algunas personas tambin se les cae el cabello durante o despus del [place], o tienen el cabello seco o fino. Cambios en la salud  Comienza a tener dolores de [place].  Es posible que tenga acidez estomacal.  Puede tener estreimiento.  Pueden aparecer hemorroides o abultarse e  hincharse las venas (venas varicosas).  Las Veterinary surgeon y estar sensibles al cepillado y al hilo dental.  Nicanor Bake vez tenga necesidad de orinar con ms frecuencia porque el feto est ejerciendo presin sobre la vejiga.  Puede sentir dolor en la espalda. Esto se debe a: ? Aumento de peso. ? Las hormonas del Management consultant las articulaciones en la pelvis. ? Un cambio en el peso y los msculos que ayudan a Pharmacologist su equilibrio. Siga estas instrucciones en su casa: Medicamentos  Siga las instrucciones del mdico en relacin con el uso de medicamentos. Durante el embarazo, hay medicamentos que pueden tomarse y otros que no. No tome medicamentos a menos que lo haya autorizado el mdico.  Tome vitaminas prenatales que contengan por lo menos (mcg) de cido flico. Comida y bebida  Lleve una dieta saludable que incluya frutas y verduras frescas, cereales integrales, buenas fuentes de protenas como carnes [place], huevos o tofu, y productos lcteos descremados.  Evite la carne cruda y el [place], la [place] y el queso sin Market researcher. Estos portan grmenes que pueden provocar dao tanto a usted como al beb.  Es posible que tenga que tomar estas medidas para prevenir o tratar el estreimiento: ? Product manager suficiente lquido como para Pharmacologist la orina de color amarillo plido. ? Consumir alimentos ricos en fibra, como frijoles, cereales integrales, y frutas y verduras frescas. ? Limitar el consumo de alimentos ricos en grasa y azcares procesados, como los alimentos fritos o dulces. Actividad  Haga ejercicio solamente como se lo haya indicado el mdico. La mayora de las personas pueden continuar su rutina de ejercicios durante el [place]. Intente realizar como mnimo de actividad fsica por lo menos 5das a la semana. Deje de hacer ejercicio si comienza a Teacher, music.  Deje de hacer ejercicio si le aparecen dolor o clicos en la parte baja del  vientre o de la espalda.  Evite hacer ejercicio si hace mucho calor o humedad, o si se encuentra a una altitud elevada.  Evite levantar pesos [employer]ands.  Si lo desea, puede seguir teniendo [employer], salvo que el mdico le indique lo contrario. Alivio del dolor y del Dentist  Use un sujetador que le brinde buen soporte para prevenir las molestias causadas por la sensibilidad en las [place].  Dese baos de asiento con agua tibia para Engineer, materials o las molestias causadas por las hemorroides. Use una crema para las hemorroides si el mdico la autoriza.  Descanse con las piernas levantadas (elevadas) si tiene calambres en las piernas o dolor en la parte baja de la espalda.  Si tiene venas varicosas: ? Use medias de compresin como se lo haya indicado el mdico. ? Eleve los pies durante , 3 o 4veces por da. ? Limite el consumo de sal en su dieta. Seguridad  Use el cinturn de seguridad en todo momento mientras conduce o va en auto.  Hable con el mdico si es vctima de [employer] o fsico. Estilo de vida  No se d baos de inmersin en agua caliente, baos turcos ni saunas.  No se haga duchas vaginales. No use tampones ni toallas higinicas perfumadas.  Evite el contacto con las bandejas sanitarias de los gatos y la tierra que estos animales usan. Estos elementos contienen bacterias que pueden causar defectos congnitos al beb y la posible prdida del feto debido a un aborto espontneo o muerte fetal.  No use remedios a base de hierbas, alcohol, drogas ilegales ni medicamentos que no estn aprobados por el mdico. Las sustancias qumicas de estos productos pueden daar al beb.  No consuma ningn producto que contenga nicotina o tabaco, como cigarrillos, cigarrillos electrnicos y tabaco de Theatre manager. Si necesita ayuda para dejar de fumar, consulte al mdico. Instrucciones generales  Durante una visita prenatal de rutina, el [employer] har un examen fsico  y Probation officer. Tambin le hablar sobre su salud general. Cumpla con todas las visitas de seguimiento. Esto es importante.  Pdale al mdico que la derive a clases de educacin prenatal en su localidad.  Pida ayuda si tiene necesidades nutricionales o de asesoramiento Academic librarian. El mdico puede aconsejarla o derivarla a especialistas para que la ayuden con diferentes necesidades. Dnde buscar ms informacin  American Pregnancy Association (Asociacin Americana del Embarazo): americanpregnancy.org  Celanese Corporation of Obstetricians and Gynecologists (Colegio Estadounidense de Obstetras y [place]): EmploymentAssurance.cz?  Office on Pitney Bowes (Oficina para la Salud de la Mujer): [email or web address] Comunquese con un mdico si tiene:  Un dolor de cabeza que no desaparece despus de Science writer.  Cambios en la visin o ve manchas delante de los ojos.  Clicos leves, presin en la pelvis o dolor persistente en el abdomen.  Nuseas persistentes, vmitos o diarrea.  Secrecin vaginal con mal olor u orina con [employer] ftido.  Dolor al Beatrix Shipper.  Hinchazn sbita o extrema del rostro, las [address], los tobillos, los pies o las piernas.  Grant Ruts. Busque ayuda de inmediato si:  Tiene una prdida de lquido por la vagina.  Tiene sangrado ligero o manchas vaginales.  Tiene dolor intenso o clicos en el abdomen.  Presenta dificultad para respirar.  Siente dolor en el pecho.  Tiene episodios de [employer].  No ha sentido a su beb moverse durante el perodo de [employer] indic el mdico.  Tiene dolor, hinchazn o enrojecimiento nuevos o ms intensos en un brazo o una pierna. Resumen  El segundo trimestre de embarazo va desde la semana 13 hasta la 27 (desde el mes 4 hasta el 6).  No use remedios a base de hierbas, alcohol, drogas ilegales ni medicamentos que no estn aprobados por el mdico. Las sustancias qumicas de estos productos pueden  daar al beb.  Haga ejercicio solamente como se lo haya indicado el mdico. La mayora de las personas pueden continuar su rutina de ejercicios durante el [place].  Cumpla con todas las visitas de seguimiento. Esto es importante. Esta informacin no tiene Theme park manager el consejo del mdico. Asegrese de hacerle al mdico cualquier pregunta que tenga. Document Revised: 04/12/2020 Document Reviewed: 04/12/2020 Elsevier Patient Education  2021 Elsevier Inc.   [employer] Company del mtodo anticonceptivo Contraception Choices La anticoncepcin, o los mtodos anticonceptivos, hace referencia a los mtodos o dispositivos que evitan el [place]. Mtodos hormonales Implante anticonceptivo Un implante anticonceptivo consiste en un tubo delgado de plstico que contiene una hormona que evita el [place]. Es diferente de un dispositivo intrauterino (DIU). Un mdico lo inserta en la parte superior del brazo. Los implantes pueden ser eficaces durante un mximo de 3 aos. Inyecciones de progestina sola Las inyecciones de progestina sola contienen progestina, una forma sinttica de la hormona progesterona. Un mdico las administra cada 3 meses. Pldoras anticonceptivas Las pldoras anticonceptivas son pastillas que contienen hormonas que evitan el [place]. Deben tomarse una vez al da, preferentemente a la misma Economist. Se necesita una receta para utilizar este mtodo anticonceptivo. Parche anticonceptivo El parche anticonceptivo contiene hormonas que evitan el [place]. Se coloca en la piel, debe cambiarse una vez a la semana durante tres semanas y debe retirarse en la cuarta semana. Se necesita una receta para utilizar este mtodo anticonceptivo. Anillo vaginal Un anillo vaginal contiene hormonas que evitan el embarazo. Se coloca en la vagina durante tres semanas y se retira en la cuarta semana. Luego se repite el proceso con un anillo nuevo. Se necesita una receta para utilizar este mtodo  anticonceptivo. Anticonceptivo de emergencia Los anticonceptivos de emergencia son mtodos para evitar un embarazo despus de Warehouse manager sexo sin proteccin. Vienen en forma de pldora y pueden tomarse hasta 5 das despus de [place]. Funcionan mejor cuando se toman lo ms pronto posible luego de eBay. La mayora de los anticonceptivos de emergencia estn disponibles sin receta mdica. Este mtodo no debe utilizarse como el nico mtodo anticonceptivo.   Mtodos de barrera Condn masculino Un condn masculino es una vaina delgada que se coloca sobre el pene durante el sexo. Los condones evitan que el esperma ingrese en el cuerpo de la [place]. Pueden utilizarse con un una sustancia que mata a los espermatozoides (espermicida) para aumentar la efectividad. Deben desecharse despus  de un uso. Condn femenino Un condn femenino es una vaina blanda y holgada que se coloca en la vagina antes de [place]. El condn evita que el esperma ingrese en el cuerpo de la [place]. Deben desecharse despus de un uso. Diafragma Un diafragma es una barrera blanda con forma de cpula. Se inserta en la vagina antes del sexo, junto con un espermicida. El diafragma bloquea el ingreso de esperma en el tero, y el espermicida mata a los espermatozoides. El Designer, fashion/clothing en la vagina durante 6 a 8 horas despus de Warehouse manager sexo y debe retirarse en el plazo de las 24 horas. Un diafragma es recetado y colocado por un mdico. Debe reemplazarse cada 1 a 2 aos, despus de dar a luz, de aumentar ms de 15lb (6.8kg) y de [place] plvica. Capuchn cervical Un capuchn cervical es una copa redonda y blanda de ltex o plstico que se coloca en el cuello uterino. Se inserta en la vagina antes del sexo, junto con un espermicida. Bloquea el ingreso del esperma en el tero. El capuchn Radio producer durante 6 a 8 horas despus de Warehouse manager sexo y debe retirarse en el plazo de las 48 horas. Un capuchn cervical debe  ser recetado y colocado por un mdico. Debe reemplazarse cada 2aos. Esponja Una esponja es una pieza blanda y circular de espuma de poliuretano que contiene espermicida. La esponja ayuda a bloquear el ingreso de esperma en el tero, y el espermicida mata a los espermatozoides. Belva Bertin, debe humedecerla e insertarla en la vagina. Debe insertarse antes de eBay, debe permanecer dentro al menos durante 6 horas despus de tener sexo y debe retirarse y Nurse, adult en el plazo de las 30 horas. Espermicidas Los espermicidas son sustancias qumicas que matan o bloquean al esperma y no lo dejan ingresar al cuello uterino y al tero. Vienen en forma de crema, gel, supositorio, espuma o comprimido. Un espermicida debe insertarse en la vagina con un aplicador al menos 10 o 15 minutos antes de tener sexo para dar tiempo a que surta [place]. El proceso debe repetirse cada vez que tenga sexo. Los espermicidas no requieren Emergency planning/management officer.   Anticonceptivos intrauterinos Dispositivo intrauterino (DIU) Un DIU es un dispositivo en forma de T que se coloca en el tero. Existen dos tipos:  DIU hormonal.Este tipo contiene progestina, una forma sinttica de la hormona progesterona. Este tipo puede permanecer colocado durante 3 a 5 aos.  DIU de cobre.Este tipo est recubierto con un alambre de cobre. Puede permanecer colocado durante 10 aos. Mtodos anticonceptivos permanentes Ligadura de trompas en la mujer En este mtodo, se sellan, atan u obstruyen las trompas de Falopio durante una ciruga para Automotive engineer que el vulo descienda [place]. Esterilizacin histeroscpica En este mtodo, se coloca un implante pequeo y flexible dentro de cada trompa de Falopio. Los implantes hacen que se forme un tejido cicatricial en las trompas de Falopio y que las obstruya para que el espermatozoide no pueda llegar al vulo. El procedimiento demora alrededor de 3 meses para que sea [place]. Debe utilizarse otro mtodo  anticonceptivo durante esos 3 meses. Esterilizacin masculina Este es un procedimiento que consiste en atar los conductos que transportan el esperma (vasectoma). Luego del procedimiento, el hombre Manufacturing engineer lquido (semen). Debe utilizarse otro mtodo anticonceptivo durante 3 meses despus del procedimiento. Mtodos de planificacin natural Planificacin familiar natural En este mtodo, la pareja no tiene [employer] Family Insurance la mujer podra [employer]  embarazada. Mtodo calendario En este mtodo, la mujer realiza un seguimiento de la duracin de cada ciclo menstrual, identifica los [employer] que se puede producir un Psychiatrist y no tiene sexo durante esos [address]  [address]. Mtodo de la ovulacin En este mtodo, la pareja evita tener sexo durante la ovulacin. Mtodo sintotrmico Este mtodo implica no tener sexo durante la ovulacin. Normalmente, la mujer comprueba la ovulacin al observar cambios en su temperatura y en la consistencia del moco cervical. Mtodo posovulacin En este mtodo, la pareja espera a que finalice la ovulacin para Doctor, hospital. Dnde buscar ms informacin  Centers for Disease Control and Prevention (Centros para el Control y Psychiatrist de Event organiser): [email or web address] Resumen  La anticoncepcin, o los mtodos anticonceptivos, hace referencia a los mtodos o dispositivos que evitan el [place].  Los mtodos anticonceptivos hormonales incluyen implantes, inyecciones, pastillas, parches, anillos vaginales y anticonceptivos de Associate Professor.  Los mtodos anticonceptivos de barrera pueden incluir condones masculinos, condones femeninos, diafragmas, capuchones cervicales, esponjas y espermicidas.  [employer] tipos de DIU (dispositivo intrauterino). Un DIU puede colocarse en el tero de una mujer para evitar el embarazo durante 3 a 5 aos.  La esterilizacin permanente puede realizarse mediante un procedimiento tanto en los hombres como en las mujeres. Los [employer] de  Medical sales representative natural implican no tener American Family Insurance la mujer podra quedar [place]. Esta informacin no tiene Theme park manager el consejo del mdico. Asegrese de hacerle al mdico cualquier pregunta que tenga. Document Revised: 05/04/2020 Document Reviewed: 05/04/2020 Elsevier Patient Education  2021 Elsevier Inc.   [place] materna Breastfeeding  Decidir amamantar es una de las mejores elecciones que puede hacer por usted y su beb. Un cambio en las hormonas durante el embarazo hace que las mamas produzcan leche materna en las glndulas productoras de [place]. Las hormonas impiden que la leche materna sea liberada antes del nacimiento del beb. Adems, impulsan el flujo de leche luego del nacimiento. Una vez que ha comenzado a Museum/gallery exhibitions officer, Conservation officer, nature beb, as Immunologist succin o Theatre manager, pueden estimular la liberacin de [place] de las glndulas productoras de [place]. Los beneficios de [employer] investigaciones demuestran que la lactancia materna ofrece muchos beneficios de salud para bebs y [place]. Adems, ofrece una forma gratuita y conveniente de Corporate treasurer al beb. Para el beb  La primera leche (calostro) ayuda a Careers information officer funcionamiento del aparato digestivo del beb.  Las clulas especiales de la leche (anticuerpos) ayudan a Artist las infecciones en el beb.  Los bebs que se alimentan con leche materna tambin tienen menos probabilidades de tener asma, alergias, obesidad o diabetes de tipo 2. Adems, tienen menor riesgo de sufrir el sndrome de muerte sbita del lactante (SMSL).  Los nutrientes de la [place] materna son mejores para Patent examiner las necesidades del beb en comparacin con la [employer].  La leche materna mejora el desarrollo cerebral del beb. Para usted  La lactancia materna favorece el desarrollo de un vnculo muy especial entre la madre y el beb.  Es conveniente. La leche materna es econmica y siempre est disponible  a la Human resources officer.  La lactancia materna ayuda a quemar caloras. Claude Manges a perder el peso ganado durante el [place].  Hace que el tero vuelva al tamao que tena antes del embarazo ms rpido. Adems, disminuye el sangrado (loquios) despus del parto.  La lactancia materna contribuye a reducir Nurse, adult de tener diabetes de tipo 2, osteoporosis, artritis reumatoide, enfermedades cardiovasculares y cncer de [place],  ovario, tero y endometrio en el futuro. Informacin bsica sobre la lactancia Comienzo de la lactancia  Encuentre un lugar cmodo para sentarse o Teacher, musicacostarse, con un buen respaldo para el cuello y la espalda.  Coloque una almohada o una manta enrollada debajo del beb para acomodarlo a la altura de la mama (si est sentada). Las almohadas para Museum/gallery exhibitions officeramamantar se han diseado especialmente a fin de servir de apoyo para los brazos y el beb [place] Foodsmientras amamanta.  Asegrese de que la barriga del beb (abdomen) est frente a la suya.  Masajee suavemente la mama. Con las yemas de los dedos, Liberty Mediamasajee los bordes exteriores de la mama hacia adentro, en direccin al pezn. Esto estimula el flujo de [place]leche. Si la Home Depotleche fluye lentamente, es posible que deba Educational psychologistcontinuar con este movimiento durante la Market researcherlactancia.  Sostenga la mama con 4 dedos por debajo y Multimedia programmerel pulgar por arriba del pezn (forme la letra "C" con la mano). Asegrese de que los dedos se encuentren lejos del pezn y de la boca del beb.  Empuje suavemente los labios del beb con el pezn o con el dedo.  Cuando la boca del beb se abra lo suficiente, acrquelo rpidamente a la mama e introduzca todo el pezn y la arola, tanto como sea posible, dentro de la boca del beb. La arola es la zona de color que rodea al pezn. ? Debe haber ms arola visible por arriba del labio superior del beb que por debajo del labio inferior. ? Los labios del beb deben estar abiertos y extendidos hacia afuera (evertidos) para asegurar que el beb se prenda  de forma adecuada y cmoda. ? La lengua del beb debe estar entre la enca inferior y Educational psychologistla mama.  Asegrese de que la boca del beb est en la posicin correcta alrededor del pezn (prendido). Los labios del beb deben crear un sello sobre la mama y estar doblados hacia afuera (invertidos).  Es comn que el beb succione durante 2 a 3 minutos para que comience el flujo de Long[place] Heightsleche materna. Cmo debe prenderse Es muy importante que le ensee al beb cmo prenderse adecuadamente a la mama. Si el beb no se prende adecuadamente, puede causar Federated Department Storesdolor en los pezones, reducir la produccin de [place] materna y Radio producerhacer que el beb tenga un escaso aumento de [place] Beachpeso. Adems, si el beb no se prende adecuadamente al pezn, puede tragar aire durante la alimentacin. Esto puede causarle molestias al beb. Hacer eructar al beb al Pilar Platecambiar de mama puede ayudarlo a liberar el aire. Sin embargo, ensearle al beb cmo prenderse a la mama adecuadamente es la mejor manera de evitar que se sienta molesto por tragar Oceanographeraire mientras se alimenta. Signos de que el beb se ha prendido adecuadamente al pezn  Tironea o succiona de modo silencioso, sin Publishing rights managercausarle dolor. Los labios del beb deben estar extendidos hacia afuera (evertidos).  Se escucha que traga cada 3 o 4 succiones una vez que la WPS Resourcesleche ha comenzado a Radiographer, therapeuticfluir (despus de que se produzca el reflejo de eyeccin de la [place]che).  Hay movimientos musculares por arriba y por delante de sus odos al Printmakersuccionar. Signos de que el beb no se ha prendido Audiological scientistadecuadamente al pezn  Hace ruidos de succin o de chasquido mientras se Tree surgeonalimenta.  Siente dolor en los pezones. Si cree que el beb no se prendi correctamente, deslice el dedo en la comisura de la boca y Ameren Corporationcolquelo entre las encas del beb para interrumpir la succin. Intente volver a comenzar a Museum/gallery exhibitions officeramamantar. Signos  de lactancia materna exitosa Signos del beb  El beb disminuir gradualmente el nmero de succiones o dejar de succionar  por completo.  El beb se quedar dormido.  El cuerpo del beb se relajar.  El beb retendr Neomia Dear pequea cantidad de [facility] boca.  El beb se desprender solo del [place]. Signos que presenta usted  Las mamas han aumentado la firmeza, el peso y el tamao 1 a 3 horas despus de Museum/gallery exhibitions officer.  Estn ms blandas inmediatamente despus de amamantar.  Se producen un aumento del volumen de [place] y un cambio en su consistencia y color hacia el quinto da de Market researcher.  Los pezones no duelen, no estn agrietados ni sangran. Signos de que su beb recibe la cantidad de leche suficiente  Mojar por lo menos 1 o 2paales durante las primeras 24horas despus del nacimiento.  Mojar por lo menos 5 o 6paales cada 24horas durante la primera semana despus del nacimiento. La orina debe ser clara o de color amarillo plido a los 5das de vida.  Mojar entre 6 y 8paales cada 24horas a medida que el beb sigue creciendo y desarrollndose.  Defeca por lo menos 3 veces en 24 horas a los 5 [address]  [address] de [address] Las heces deben ser blandas y Armed forces operational officer.  Defeca por lo menos 3 veces en 24 horas a los [address] de [address] Las heces deben ser grumosas y Armed forces operational officer.  No registra una prdida de peso mayor al 10% del peso al nacer durante los primeros 3 [address]  [address] de [place].  Aumenta de peso un promedio de 4 a 7onzas (113 a 198g) por semana despus de los 4 [address] de vida.  Aumenta de [place], [place], de Crimora uniforme a Glass blower/designer de los [address] de vida, sin Passenger transport manager prdida de peso despus de las 2semanas de vida. Despus de alimentarse, es posible que el beb regurgite una pequea cantidad de [place]. Esto es normal. Frecuencia y duracin de la lactancia El amamantamiento frecuente la ayudar a producir ms [place] y puede prevenir dolores en los pezones y las mamas extremadamente llenas (congestin [place]). Alimente al beb cuando muestre signos de hambre o si siente la necesidad de reducir la congestin de las  [place]. Esto se denomina "lactancia a demanda". Las seales de que el beb tiene hambre incluyen las siguientes:  Aumento del [place] de [place], [place] o inquietud.  Mueve la cabeza de un lado a otro.  Abre la boca cuando se le toca la mejilla o la comisura de la boca (reflejo de bsqueda).  Aumenta las vocalizaciones, tales como sonidos de succin, se relame los labios, emite arrullos, suspiros o chirridos.  Mueve la [employer] boca y se chupa los dedos o las manos.  Est molesto o llora. Evite el uso del chupete en las primeras 4 a 6 semanas despus del nacimiento del beb. Despus de este perodo, podr usar un chupete. Las investigaciones demostraron que el uso del chupete durante Financial risk analyst ao de vida del beb disminuye el riesgo de tener el sndrome de muerte sbita del lactante (SMSL). Permita que el nio se alimente en cada mama todo lo que desee. Cuando el beb se desprende o se queda dormido mientras se est alimentando de la primera mama, ofrzcale la segunda. Debido a que, con frecuencia, los recin nacidos estn somnolientos las primeras semanas de vida, es posible que deba despertar al beb para alimentarlo. Los horarios de Acupuncturist de un beb a otro. Sin embargo, las siguientes reglas pueden servir como gua  para ayudarla a garantizar que el beb se alimenta adecuadamente:  Se puede amamantar a los recin nacidos (bebs de 4 semanas o menos de vida) cada 1 a 3 horas.  No deben transcurrir ms de 3 horas durante el da o 5 horas durante la noche sin que se amamante a los recin nacidos.  Debe amamantar al beb un mnimo de 8 veces en un perodo de 24 horas. Extraccin de Bank [employer] America extraccin y Contractor de la leche materna le permiten asegurarse de que el beb se alimente exclusivamente de su leche materna, aun en momentos en los que no puede Museum/gallery exhibitions officer. Esto tiene especial importancia si debe regresar al Aleen Campi en el perodo en que an est amamantando  o si no puede estar presente en los momentos en que el beb debe alimentarse. Su asesor en lactancia puede ayudarla a Clinical research associate un mtodo de extraccin que funcione mejor para usted y Programmer, systems cunto tiempo es seguro almacenar Ashley.      Cmo cuidar las mamas durante la lactancia Los pezones pueden secarse, Lobbyist y doler durante la Market researcher. Las siguientes recomendaciones pueden ayudarla a Pharmacologist las [employer] y sanas:  Careers information officer usar jabn en los pezones.  Use un sostn de soporte diseado especialmente para la lactancia materna. Evite usar sostenes con aro o sostenes muy ajustados (sostenes deportivos).  Seque al aire sus pezones durante 3 a despus de amamantar al beb.  Utilice solo apsitos de Haematologist sostn para Environmental health practitioner las prdidas de [place]. La prdida de un poco de Public [employer] Enterprise Group tomas es normal.  Utilice lanolina sobre los pezones luego de Museum/gallery exhibitions officer. La lanolina ayuda a mantener la humedad normal de la piel. La lanolina pura no es perjudicial (no es txica) para el beb. Adems, puede extraer [employer] algunas gotas de [place] materna y Engineer, maintenance (IT) suavemente esa [employer] pezones para que la [place] se seque al aire. Durante las primeras semanas despus del nacimiento, algunas mujeres experimentan [place]. La congestin [employer] puede hacer que sienta las mamas pesadas, calientes y sensibles al tacto. El pico de la congestin mamaria ocurre en el plazo de los 3 a 5 das despus del [place]. Las siguientes recomendaciones pueden ayudarla a Paramedic la congestin mamaria:  Vace por completo las mamas al [employer] o Environmental health practitioner. Puede aplicar calor hmedo en las mamas (en la ducha o con toallas hmedas para manos) antes de Museum/gallery exhibitions officer o extraer WPS Resources. Esto aumenta la circulacin y [place] a que la [place]. Si el beb no vaca por completo las [address] cuando lo [address], extraiga la Graf restante despus de que haya  finalizado.  Aplique compresas de hielo Yahoo[employer] Inc inmediatamente despus de Museum/gallery exhibitions officer o extraer [place], a menos que le resulte demasiado incmodo. Haga lo siguiente: ? Ponga el hielo en una bolsa plstica. ? Coloque una FirstEnergy Corp piel y la bolsa de hielo. ? Coloque el hielo durante , 2 o 3veces por da.  Asegrese de que el beb est prendido y se encuentre en la posicin correcta mientras lo alimenta. Si la congestin mamaria persiste luego de 48 horas o despus de seguir estas recomendaciones, comunquese con su mdico o un Holiday representative. Recomendaciones de salud general durante la lactancia  Consuma 3 comidas y 3 colaciones saludables todos los [employer]. Las [employer] Holdings bien alimentadas que amamantan necesitan entre 450 y 500 caloras adicionales por Futures trader. Puede cumplir con este requisito al aumentar la cantidad de una dieta equilibrada que realice.  Beba suficiente agua para mantener la orina clara o de color amarillo plido.  Descanse con frecuencia, reljese y siga tomando sus vitaminas prenatales para prevenir la fatiga, el estrs y los niveles bajos de vitaminas y [employer] en el cuerpo (deficiencias de nutrientes).  No consuma ningn producto que contenga nicotina o tabaco, como cigarrillos y Administrator, Civil Service. El beb puede verse afectado por las sustancias qumicas de los cigarrillos que pasan a la [place] materna y por la exposicin al humo ambiental del tabaco. Si necesita ayuda para dejar de fumar, consulte al mdico.  Evite el consumo de alcohol.  No consuma drogas ilegales o marihuana.  Antes de Dietitian, hable con el mdico. Estos incluyen medicamentos recetados y de [place], como tambin vitaminas y suplementos a base de hierbas. Algunos medicamentos, que pueden ser perjudiciales para el beb, pueden pasar a travs de la Colgate Pal[employer].  Puede quedar embarazada durante la lactancia. Si se desea un mtodo anticonceptivo, consulte  al mdico sobre cules son las opciones seguras durante la Market researcher. Dnde encontrar ms informacin: Liga internacional [place]: [email or web address] Comunquese con un mdico si:  Siente que quiere dejar de Museum/gallery exhibitions officer o se siente frustrada con la lactancia.  Sus pezones estn agrietados o Water quality scientist.  Sus mamas estn irritadas, sensibles o calientes.  Tiene los siguientes sntomas: ? Dolor en las mamas o en los pezones. ? Un rea hinchada en cualquiera de las mamas. ? Grant Ruts o escalofros. ? Nuseas o vmitos. ? Drenaje de otro lquido distinto de la WPS Resources materna desde los pezones.  Sus mamas no se llenan antes de Museum/gallery exhibitions officer al beb para el quinto da despus del [place].  Se siente triste y deprimida.  El beb: ? Est demasiado somnoliento como para comer bien. ? Tiene problemas para dormir. ? Tiene ms de 1 semana de vida y [employer] de 6 paales en un periodo de 24 horas. ? No ha aumentado de [place] a los [address] de [address]  El beb defeca menos de 3 veces en 24 horas.  La piel del beb o las partes blancas de los ojos se vuelven amarillentas. Solicite ayuda de inmediato si:  El beb est muy cansado Retail buyer) y no se quiere despertar para comer.  Le sube la fiebre sin causa. Resumen  La lactancia materna ofrece muchos beneficios de salud para bebs y [place].  Intente amamantar a su beb cuando muestre signos tempranos de hambre.  Haga cosquillas o empuje suavemente los labios del beb con el dedo o el pezn para lograr que el beb abra la boca. Acerque el beb a la mama. Asegrese de que la mayor parte de la arola se encuentre dentro de la boca del beb. Ofrzcale una mama y haga eructar al beb antes de pasar a la otra.  Hable con su mdico o asesor en lactancia si tiene dudas o problemas con la lactancia. Esta informacin no tiene Theme park manager el consejo del mdico. Asegrese de hacerle al mdico cualquier pregunta que tenga. Document Revised: 12/27/2017 Document  Reviewed: 01/22/2017 Elsevier Patient Education  2021 [employer].
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
## 2021-02-13 ENCOUNTER — Emergency Department (HOSPITAL_COMMUNITY)
Admission: EM | Admit: 2021-02-13 | Discharge: 2021-02-14 | Disposition: A | Discharge status: Home / Self Care | Payer: Medicaid Other | Attending: Emergency Medicine | Admitting: Emergency Medicine

## 2021-02-13 ENCOUNTER — Encounter (HOSPITAL_COMMUNITY): Payer: Self-pay | Admitting: Emergency Medicine

## 2021-02-13 ENCOUNTER — Emergency Department (HOSPITAL_COMMUNITY): Payer: Medicaid Other

## 2021-02-13 DIAGNOSIS — Z20822 Contact with and (suspected) exposure to covid-19: Secondary | ICD-10-CM | POA: Diagnosis not present

## 2021-02-13 DIAGNOSIS — M545 Low back pain, unspecified: Secondary | ICD-10-CM | POA: Diagnosis not present

## 2021-02-13 DIAGNOSIS — Z3A12 12 weeks gestation of pregnancy: Secondary | ICD-10-CM | POA: Diagnosis not present

## 2021-02-13 DIAGNOSIS — G4489 Other headache syndrome: Secondary | ICD-10-CM | POA: Diagnosis not present

## 2021-02-13 DIAGNOSIS — O26891 Other specified pregnancy related conditions, first trimester: Secondary | ICD-10-CM | POA: Insufficient documentation

## 2021-02-13 DIAGNOSIS — G8929 Other chronic pain: Secondary | ICD-10-CM

## 2021-02-13 DIAGNOSIS — M549 Dorsalgia, unspecified: Secondary | ICD-10-CM

## 2021-02-13 LAB — CBC WITH DIFFERENTIAL/PLATELET
Abs Immature Granulocytes: 0.35 10*3/uL — ABNORMAL HIGH (ref 0.00–0.07)
Basophils Absolute: 0 10*3/uL (ref 0.0–0.1)
Basophils Relative: 0 %
Eosinophils Absolute: 0 10*3/uL (ref 0.0–0.5)
Eosinophils Relative: 0 %
HCT: 44.9 % (ref 36.0–46.0)
Hemoglobin: 14.6 g/dL (ref 12.0–15.0)
Immature Granulocytes: 5 %
Lymphocytes Relative: 17 %
Lymphs Abs: 1.2 10*3/uL (ref 0.7–4.0)
MCH: 30.6 pg (ref 26.0–34.0)
MCHC: 32.5 g/dL (ref 30.0–36.0)
MCV: 94.1 fL (ref 80.0–100.0)
Monocytes Absolute: 0.8 10*3/uL (ref 0.1–1.0)
Monocytes Relative: 11 %
Neutro Abs: 4.7 10*3/uL (ref 1.7–7.7)
Neutrophils Relative %: 67 %
Platelets: 190 10*3/uL (ref 150–400)
RBC: 4.77 MIL/uL (ref 3.87–5.11)
RDW: 15 % (ref 11.5–15.5)
WBC: 7 10*3/uL (ref 4.0–10.5)
nRBC: 0 % (ref 0.0–0.2)

## 2021-02-13 LAB — BASIC METABOLIC PANEL
Anion gap: 10 (ref 5–15)
BUN: 8 mg/dL (ref 6–20)
CO2: 24 mmol/L (ref 22–32)
Calcium: 8.5 mg/dL — ABNORMAL LOW (ref 8.9–10.3)
Chloride: 103 mmol/L (ref 98–111)
Creatinine, Ser: 0.47 mg/dL (ref 0.44–1.00)
GFR, Estimated: >60 mL/min (ref >60)
Glucose, Bld: 136 mg/dL — ABNORMAL HIGH (ref 70–99)
Potassium: 3.5 mmol/L (ref 3.5–5.1)
Sodium: 137 mmol/L (ref 135–145)

## 2021-02-13 LAB — I-STAT BETA HCG BLOOD, ED (MC, WL, AP ONLY): I-stat hCG, quantitative: >2000 m[IU]/mL — ABNORMAL HIGH (ref <5)

## 2021-02-13 LAB — LIPASE, BLOOD: Lipase: 32 U/L (ref 11–51)

## 2021-02-13 LAB — RESP PANEL BY RT-PCR (FLU A&B, COVID) ARPGX2
Influenza A by PCR: NEGATIVE
Influenza B by PCR: NEGATIVE
SARS Coronavirus 2 by RT PCR: NEGATIVE

## 2021-02-13 NOTE — ED Triage Notes (Signed)
W/ the use of an interpretors, pt reports back pain and a headache since last Thursdays.  Pt also reports bouts of sweating and feeling very tired.  Denies fevers, n/v/d.
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
## 2021-02-13 NOTE — ED Notes (Signed)
Pt ambulated to restroom. 

## 2021-02-13 NOTE — ED Notes (Signed)
Pt in radiology at this time. 
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
## 2021-02-14 LAB — URINALYSIS, ROUTINE W REFLEX MICROSCOPIC
Bilirubin Urine: NEGATIVE
Glucose, UA: NEGATIVE mg/dL
Hgb urine dipstick: NEGATIVE
Ketones, ur: NEGATIVE mg/dL
Leukocytes,Ua: NEGATIVE
Nitrite: NEGATIVE
Protein, ur: NEGATIVE mg/dL
Specific Gravity, Urine: 1.024 (ref 1.005–1.030)
pH: 5 (ref 5.0–8.0)

## 2021-02-14 NOTE — Discharge Instructions (Signed)
You were seen today for low back pain and headache.  Our evaluation did not identify the underlying cause of your symptoms today.  Your symptoms have been chronic and we do recommend you continue to follow-up with your primary care provider.  Your labs are reassuring for any new abnormality at this time.  It is important for you to continue to follow-up with the appropriate specialist and we have provided ENT communication given this sensation in your throat that has been present over the past few months.  You should also continue to follow-up with your gynecologist regarding her pregnancy.  Thank you for the opportunity to participate in your care.  Return with any change in your symptoms including fevers or chills, nausea or vomiting, syncope or shortness of breath.

## 2021-02-15 LAB — CYTOLOGY - PAP
Comment: NEGATIVE
Diagnosis: NEGATIVE
High risk HPV: NEGATIVE

## 2021-03-11 ENCOUNTER — Other Ambulatory Visit: Payer: Self-pay

## 2021-03-11 ENCOUNTER — Ambulatory Visit (INDEPENDENT_AMBULATORY_CARE_PROVIDER_SITE_OTHER): Payer: Medicaid Other | Admitting: Advanced Practice Midwife

## 2021-03-11 ENCOUNTER — Encounter: Payer: Self-pay | Admitting: Family Medicine

## 2021-03-11 VITALS — BP 123/81 | HR 81 | Wt 169.0 lb

## 2021-03-11 DIAGNOSIS — Z3490 Encounter for supervision of normal pregnancy, unspecified, unspecified trimester: Secondary | ICD-10-CM

## 2021-03-11 DIAGNOSIS — O99891 Other specified diseases and conditions complicating pregnancy: Secondary | ICD-10-CM

## 2021-03-11 DIAGNOSIS — M549 Dorsalgia, unspecified: Secondary | ICD-10-CM

## 2021-03-11 DIAGNOSIS — Z789 Other specified health status: Secondary | ICD-10-CM

## 2021-03-11 DIAGNOSIS — Z3A16 16 weeks gestation of pregnancy: Secondary | ICD-10-CM

## 2021-03-11 DIAGNOSIS — O26892 Other specified pregnancy related conditions, second trimester: Secondary | ICD-10-CM

## 2021-03-11 DIAGNOSIS — G8929 Other chronic pain: Secondary | ICD-10-CM

## 2021-03-11 DIAGNOSIS — O219 Vomiting of pregnancy, unspecified: Secondary | ICD-10-CM

## 2021-03-11 DIAGNOSIS — K0389 Other specified diseases of hard tissues of teeth: Secondary | ICD-10-CM

## 2021-03-11 DIAGNOSIS — R519 Headache, unspecified: Secondary | ICD-10-CM

## 2021-03-11 DIAGNOSIS — K2100 Gastro-esophageal reflux disease with esophagitis, without bleeding: Secondary | ICD-10-CM

## 2021-03-11 DIAGNOSIS — M545 Low back pain, unspecified: Secondary | ICD-10-CM

## 2021-03-11 DIAGNOSIS — L709 Acne, unspecified: Secondary | ICD-10-CM

## 2021-03-11 MED ORDER — CYCLOBENZAPRINE HCL 5 MG PO TABS
5.0000 mg | ORAL_TABLET | Freq: Three times a day (TID) | ORAL | 0 refills | Status: DC | PRN
Start: 2021-03-11 — End: 2021-05-19

## 2021-03-11 MED ORDER — CLINDAMYCIN PHOSPHATE 1 % EX GEL
Freq: Two times a day (BID) | CUTANEOUS | 2 refills | Status: DC
Start: 2021-03-11 — End: 2021-04-07

## 2021-03-11 MED ORDER — OMEPRAZOLE MAGNESIUM 20 MG PO TBEC: 20.0000 mg | DELAYED_RELEASE_TABLET | Freq: Every day | ORAL | 5 refills | Status: DC

## 2021-03-11 MED ORDER — ONDANSETRON HCL 4 MG PO TABS: 4.0000 mg | ORAL_TABLET | Freq: Three times a day (TID) | ORAL | 2 refills | Status: DC | PRN
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
## 2021-03-11 NOTE — Patient Instructions (Signed)
Opciones de alimentos para pacientes adultos con enfermedad de reflujo gastroesofgico Food Choices for Gastroesophageal Reflux Disease, Adult Si tiene enfermedad de reflujo gastroesofgico (ERGE), los alimentos que consume y los hbitos de alimentacin son muy importantes. Elegir los alimentos adecuados puede ayudar a aliviar las molestias ocasionadas por la ERGE. Considere la posibilidad de trabajar con un nutricionista que lo ayude a hacer elecciones saludables. Consejos para seguir este plan Leer las etiquetas de los alimentos  Elija alimentos que tengan bajo contenido de grasas saturadas. Los alimentos que tienen menos del 5% de los valores diarios (VD) de grasa y 0 g de grasas trans pueden aliviar los sntomas. Al cocinar  Evite frer los alimentos a la hora de la coccin. En su lugar, puede hacerlos al horno, al vapor, a la plancha o en la parrilla. Estos son mtodos que no necesitan mucha grasa para cocinar.  Para agregar sabor, trate de consumir hierbas con bajo contenido de picante y acidez. Planificacin de las comidas  Elija alimentos saludables con bajo contenido de grasa, como frutas, verduras, cereales integrales, productos lcteos descremados, carne magra de vaca, de pescado y de ave.  Haga comidas pequeas con frecuencia en lugar de tres comidas abundantes al da. Coma lentamente, en un ambiente distendido. Evite agacharse o recostarse hasta 2 o 3horas despus de haber comido.  Limite los alimentos con alto contenido graso como las carnes grasas o los alimentos fritos.  Limite su ingesta de alimentos grasos, como aceites, manteca y margarina.  Si el mdico se lo ha indicado, evite lo siguiente: ? Consumir alimentos que le ocasionen sntomas. Pueden ser distintos para cada persona. Lleve un registro de los alimentos para identificar aquellos que le causen sntomas. ? Alcohol. ? Beber grandes cantidades de lquido con las comidas. ? Comer 2 o 3 horas antes de acostarse.    Estilo de vida  Mantenga un peso saludable. Pregunte a su mdico cul es un peso saludable para usted. Si debe perder peso, hable con su mdico para hacerlo de manera segura.  Realice actividad fsica durante, al menos, 30 minutos 5 das por semana o ms, o segn lo indicado por su mdico.  Evite usar ropa ajustada alrededor de la cintura y el pecho.  No consuma ningn producto que contenga nicotina o tabaco. Estos productos incluyen cigarrillos, tabaco para mascar y aparatos de vapeo, como los cigarrillos electrnicos. Si necesita ayuda para dejar de fumar, consulte al mdico.  Duerma con la cabecera de la cama elevada. Use una cua debajo del colchn o bloques debajo del armazn de la cama para mantener la cabecera de la cama elevada.  Mastique chicle sin azcar despus de las comidas. Qu alimentos debo comer? Siga una dieta saludable y bien equilibrada que incluya abundantes frutas, verduras, cereales integrales, productos lcteos descremados, carnes magras, pescado y aves. Cada persona es diferente. Los alimentos que pueden desencadenar sntomas en una persona pueden no desencadenar ningn sntoma en otra. Trabaje con el mdico para identificar cules son los alimentos seguros para usted. Es posible que los productos mencionados arriba no formen una lista completa de las bebidas o los alimentos recomendados. Consulte a un nutricionista para obtener ms informacin.   Qu alimentos debo evitar? Limitar algunos de estos alimentos puede ayudar a controlar los sntomas de la enfermedad de reflujo gastroesofgico (ERGE). Cada persona es diferente. Consulte a un nutricionista o al mdico para que lo ayude a identificar los alimentos exactos que debe evitar, si los hubiere. Frutas Cualquier fruta que est preparada con grasa   agregada. Cualquier fruta que le ocasione sntomas. Para algunas personas, estas pueden incluir, las frutas ctricas como naranja, pomelo, pia y limn. Verduras Verduras  fritas en abundante aceite. Papas fritas. Cualquier verdura que est preparada con grasa agregada. Cualquier verdura que le ocasione sntomas. Para algunas personas, estas pueden incluir tomates y productos con tomate, ajes, cebollas y ajo, y rbanos picantes. Granos Pasteles o panes sin levadura con grasa agregada. Carnes y otras protenas Carnes de alto contenido graso como carne grasa de vaca o cerdo, salchichas, costillas, jamn, salchicha, salame y tocino. Carnes o protenas fritas, lo que incluye pescado frito y pollo frito. Frutos secos y mantequillas de frutos secos, en grandes cantidades. Lcteos Leche entera y leche con chocolate. Crema agria. Crema. Helado. Queso crema. Batidos con leche. Grasas y aceites Mantequilla. Margarina. Lardo. Mantequilla clarificada. Bebidas Caf y t negro, con o sin cafena. Bebidas con gas. Refrescos. Bebidas energizantes. Jugo de fruta hecho con frutas cidas, como naranja o pomelo. Jugo de tomate. Bebidas alcohlicas. Dulces y postres Chocolate y cacao. Rosquillas. Alios y condimentos Pimienta. Menta y mentol. Sal agregada. Cualquier condimento, hierbas o aderezos que le ocasionen sntomas. Para algunas personas, esto puede incluir curry, salsa picante o aderezos para ensalada a base de vinagre. Es posible que los productos que se enumeran ms arriba no sean una lista completa de los alimentos y las bebidas que se deben evitar. Consulte a un nutricionista para obtener ms informacin. Preguntas para hacerle al mdico Los cambios en la dieta y en el estilo de vida habitualmente son los primeros pasos que se toman para manejar los sntomas de ERGE. Si los cambios en la dieta y en el estilo de vida no mejoran sus sntomas, hable con el mdico sobre medicamentos. Dnde buscar ms informacin  International Foundation for Gastrointestinal Disorders (Fundacin Internacional para los Trastornos Gastrointestinales): aboutgerd.org Resumen  Si tiene  enfermedad por reflujo gastroesofgico (ERGE), las elecciones de alimentos y estilo de vida pueden ser muy tiles para ayudar a aliviar las molestias de la ERGE.  Haga comidas pequeas con frecuencia en lugar de tres comidas abundantes al da. Coma lentamente, en un ambiente distendido. Evite agacharse o recostarse hasta 2 o 3horas despus de haber comido.  Limite los alimentos con alto contenido graso como las carnes grasas o los alimentos fritos. Esta informacin no tiene como fin reemplazar el consejo del mdico. Asegrese de hacerle al mdico cualquier pregunta que tenga. Document Revised: 05/17/2020 Document Reviewed: 05/17/2020 Elsevier Patient Education  2021 Elsevier Inc.  

## 2021-03-13 LAB — AFP, SERUM, OPEN SPINA BIFIDA
AFP MoM: 0.96
AFP Value: 32.2 ng/mL
Gest. Age on Collection Date: 16.3 weeks
Maternal Age At EDD: 31.8 yr
OSBR Risk 1 IN: 10000
Test Results:: NEGATIVE
Weight: 169 [lb_av]

## 2021-03-29 ENCOUNTER — Other Ambulatory Visit: Payer: Self-pay | Admitting: *Deleted

## 2021-03-29 ENCOUNTER — Ambulatory Visit: Payer: Medicaid Other | Attending: Family Medicine

## 2021-03-29 ENCOUNTER — Encounter: Payer: Self-pay | Admitting: Family Medicine

## 2021-03-29 ENCOUNTER — Other Ambulatory Visit: Payer: Self-pay

## 2021-03-29 DIAGNOSIS — O365990 Maternal care for other known or suspected poor fetal growth, unspecified trimester, not applicable or unspecified: Secondary | ICD-10-CM | POA: Insufficient documentation

## 2021-03-29 DIAGNOSIS — Z3490 Encounter for supervision of normal pregnancy, unspecified, unspecified trimester: Secondary | ICD-10-CM | POA: Diagnosis present

## 2021-03-29 HISTORY — DX: Maternal care for other known or suspected poor fetal growth, unspecified trimester, not applicable or unspecified: O36.5990

## 2021-04-07 ENCOUNTER — Ambulatory Visit (INDEPENDENT_AMBULATORY_CARE_PROVIDER_SITE_OTHER): Payer: Medicaid Other | Admitting: Obstetrics & Gynecology

## 2021-04-07 ENCOUNTER — Other Ambulatory Visit: Payer: Self-pay

## 2021-04-07 VITALS — BP 123/73 | HR 89 | Wt 171.2 lb

## 2021-04-07 DIAGNOSIS — Z789 Other specified health status: Secondary | ICD-10-CM

## 2021-04-07 DIAGNOSIS — L700 Acne vulgaris: Secondary | ICD-10-CM

## 2021-04-07 DIAGNOSIS — O0990 Supervision of high risk pregnancy, unspecified, unspecified trimester: Secondary | ICD-10-CM

## 2021-04-07 DIAGNOSIS — M549 Dorsalgia, unspecified: Secondary | ICD-10-CM

## 2021-04-07 DIAGNOSIS — O365990 Maternal care for other known or suspected poor fetal growth, unspecified trimester, not applicable or unspecified: Secondary | ICD-10-CM

## 2021-04-07 MED ORDER — CLINDAMYCIN PHOSPHATE 1 % EX GEL: Freq: Two times a day (BID) | CUTANEOUS | 11 refills | Status: DC
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
## 2021-04-07 NOTE — Patient Instructions (Signed)
Second Trimester of Pregnancy °The second trimester of pregnancy is from week 13 through week 27. This is also called months 4 through 6 of pregnancy. This is often the time when you feel your best. °During the second trimester: °Morning sickness is less or has stopped. °You may have more energy. °You may feel hungry more often. °At this time, your unborn baby (fetus) is growing very fast. At the end of the sixth month, the unborn baby may be up to 12 inches long and weigh about 1½ pounds. You will likely start to feel the baby move between 16 and 20 weeks of pregnancy. °Body changes during your second trimester °Your body continues to go through many changes during this time. The changes vary and generally return to normal after the baby is born. °Physical changes °You will gain more weight. °You may start to get stretch marks on your hips, belly (abdomen), and breasts. °Your breasts will grow and may hurt. °Dark spots or blotches may develop on your face. °A dark line from your belly button to the pubic area (linea nigra) may appear. °You may have changes in your hair. °Health changes °You may have headaches. °You may have heartburn. °You may have trouble pooping (constipation). °You may have hemorrhoids or swollen, bulging veins (varicose veins). °Your gums may bleed. °You may pee (urinate) more often. °You may have back pain. °Follow these instructions at home: °Medicines °Take over-the-counter and prescription medicines only as told by your doctor. Some medicines are not safe during pregnancy. °Take a prenatal vitamin that contains at least 600 micrograms (mcg) of folic acid. °Eating and drinking °Eat healthy meals that include: °Fresh fruits and vegetables. °Whole grains. °Good sources of protein, such as meat, eggs, or tofu. °Low-fat dairy products. °Avoid raw meat and unpasteurized juice, milk, and cheese. °You may need to take these actions to prevent or treat trouble pooping: °Drink enough fluids to keep  your pee (urine) pale yellow. °Eat foods that are high in fiber. These include beans, whole grains, and fresh fruits and vegetables. °Limit foods that are high in fat and sugar. These include fried or sweet foods. °Activity °Exercise only as told by your doctor. Most people can do their usual exercise during pregnancy. Try to exercise for 30 minutes at least 5 days a week. °Stop exercising if you have pain or cramps in your belly or lower back. °Do not exercise if it is too hot or too humid, or if you are in a place of great height (high altitude). °Avoid heavy lifting. °If you choose to, you may have sex unless your doctor tells you not to. °Relieving pain and discomfort °Wear a good support bra if your breasts are sore. °Take warm water baths (sitz baths) to soothe pain or discomfort caused by hemorrhoids. Use hemorrhoid cream if your doctor approves. °Rest with your legs raised (elevated) if you have leg cramps or low back pain. °If you develop bulging veins in your legs: °Wear support hose as told by your doctor. °Raise your feet for 15 minutes, 3-4 times a day. °Limit salt in your food. °Safety °Wear your seat belt at all times when you are in a car. °Talk with your doctor if someone is hurting you or yelling at you a lot. °Lifestyle °Do not use hot tubs, steam rooms, or saunas. °Do not douche. Do not use tampons or scented sanitary pads. °Avoid cat litter boxes and soil used by cats. These carry germs that can harm your baby and   can cause a loss of your baby by miscarriage or stillbirth. °Do not use herbal medicines, illegal drugs, or medicines that are not approved by your doctor. Do not drink alcohol. °Do not smoke or use any products that contain nicotine or tobacco. If you need help quitting, ask your doctor. °General instructions °Keep all follow-up visits. This is important. °Ask your doctor about local prenatal classes. °Ask your doctor about the right foods to eat or for help finding a  counselor. °Where to find more information °American Pregnancy Association: americanpregnancy.org °American College of Obstetricians and Gynecologists: www.acog.org °Office on Women's Health: womenshealth.gov/pregnancy °Contact a doctor if: °You have a headache that does not go away when you take medicine. °You have changes in how you see, or you see spots in front of your eyes. °You have mild cramps, pressure, or pain in your lower belly. °You continue to feel like you may vomit (nauseous), you vomit, or you have watery poop (diarrhea). °You have bad-smelling fluid coming from your vagina. °You have pain when you pee or your pee smells bad. °You have very bad swelling of your face, hands, ankles, feet, or legs. °You have a fever. °Get help right away if: °You are leaking fluid from your vagina. °You have spotting or bleeding from your vagina. °You have very bad belly cramping or pain. °You have trouble breathing. °You have chest pain. °You faint. °You have not felt your baby move for the time period told by your doctor. °You have new or increased pain, swelling, or redness in an arm or leg. °Summary °The second trimester of pregnancy is from week 13 through week 27 (months 4 through 6). °Eat healthy meals. °Exercise as told by your doctor. Most people can do their usual exercise during pregnancy. °Do not use herbal medicines, illegal drugs, or medicines that are not approved by your doctor. Do not drink alcohol. °Call your doctor if you get sick or if you notice anything unusual about your pregnancy. °This information is not intended to replace advice given to you by your health care provider. Make sure you discuss any questions you have with your health care provider. °Document Revised: 03/10/2020 Document Reviewed: 01/15/2020 °Elsevier Patient Education © 2022 Elsevier Inc. ° °

## 2021-04-08 ENCOUNTER — Encounter: Payer: Medicaid Other | Admitting: Obstetrics and Gynecology

## 2021-04-11 ENCOUNTER — Encounter: Payer: Self-pay | Admitting: Physical Therapy

## 2021-04-11 ENCOUNTER — Ambulatory Visit: Payer: Medicaid Other | Attending: Advanced Practice Midwife | Admitting: Physical Therapy

## 2021-04-11 ENCOUNTER — Other Ambulatory Visit: Payer: Self-pay

## 2021-04-11 DIAGNOSIS — M6281 Muscle weakness (generalized): Secondary | ICD-10-CM

## 2021-04-11 DIAGNOSIS — R2689 Other abnormalities of gait and mobility: Secondary | ICD-10-CM | POA: Diagnosis present

## 2021-04-11 DIAGNOSIS — R252 Cramp and spasm: Secondary | ICD-10-CM | POA: Insufficient documentation

## 2021-04-11 NOTE — Patient Instructions (Addendum)
  soporte abdominal  Www.Squeem.com: tienen corsets, braguitas de talle alto, bodys  Www.Honeylove.com www.yummie.com  Better Binder bueno para la maternidad Puede obtener en [employer],  Baby Belly Soporte plvico de Diane Nedra Hai Bueno durante el Clyde.com-buen sitio web tambin para obtener informacin  Spanx  serola.com cinturn SI  [employer]in[employer]ists.co.za- ayuda con el embarazo, prolapso durante el embarazo   DESARROLLO DE UN PROGRAMA DE EJERCICIO DURANTE EL [employer]  Interrumpa el ejercicio si desarrolla alguno de los siguientes: Sangrado vaginal Contracciones regulares Fuga de lquido amnitico Dificultad para respirar ANTES del esfuerzo Dolor de Landscape architect Dolor de pecho Debilidad muscular que afecta el equilibrio Dolor o hinchazn en la pantorrilla   Intensidad: Debera poder mantener una conversacin, pero necesita respirar con ms frecuencia. Si est demasiado sin aliento para hablar, el nivel es demasiado intenso  Duracin: 20-45 minutos cada sesin Si no puede hacer al menos 20 minutos, baje la intensidad Puede dividirlo en 2 sesiones por da si experimenta muchas nuseas o dificultad para respirar que ocurre ms en Designer, fashion/clothing. Incluya calentamiento aumentando la intensidad durante 5 minutos Y enfriamiento disminuyendo la intensidad durante 5 minutos  Tipo de ejercicio: Elija algo que disfrute y que sea conveniente para su estilo de vida. Ejemplo: Caminar, correr, nadar, andar en bicicleta, mquinas de ejercicio como elpticas o mquinas de remo NO: esqu acutico, buceo, sauna/jacuzzi CONSULTE CON EL MDICO: equitacin, esqu alpino o de fondo, patinaje sobre hielo, gimnasia  Frecuencia: 3-6 das por semana Mantngase activo [address] (al menos una caminata suave o una clase de yoga Casimer Bilis, por ejemplo)      Access Code: [email or web address] URL: [email or web address] Date: 04/11/2021 Prepared by: Otelia Sergeant  Exercises Seated Piriformis Stretch - 1 x daily - 7 x weekly - 2 sets - 5 reps - 45s hold Seated Scapular Retraction - 1 x daily - 7 x weekly - 2 sets - 5 reps - 45s hold Seated Hamstring Stretch - 1 x daily - 7 x weekly - 2 sets - 5 reps - 45s hold Standing Lumbar Extension - 1 x daily - 7 x weekly - 1 sets - 5 reps - 45s hold Standing Sidebending with Chair Support - 1 x daily - 7 x weekly - 1 sets - 5 reps - 45s hold   [facility] [address] [place], [place] [address] Phone # [phone] Fax [phone]
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
## 2021-04-13 ENCOUNTER — Telehealth: Payer: Self-pay | Admitting: Family Medicine

## 2021-04-13 MED ORDER — COMFORT FIT MATERNITY SUPP MED MISC: 1.0000 | 0 refills | Status: DC | PRN

## 2021-04-13 NOTE — Telephone Encounter (Signed)
Spoke with pt with Spanish interpreter Eda R.. Pt advised will have Rx at front office for pick up for maternity support belt. Pt verbalized understanding and agreeable.   Judeth Cornfield, RN

## 2021-04-19 ENCOUNTER — Encounter: Payer: Self-pay | Admitting: *Deleted

## 2021-04-20 ENCOUNTER — Encounter: Payer: Self-pay | Admitting: Physical Therapy

## 2021-04-20 ENCOUNTER — Ambulatory Visit: Payer: Medicaid Other | Attending: Advanced Practice Midwife | Admitting: Physical Therapy

## 2021-04-20 ENCOUNTER — Other Ambulatory Visit: Payer: Self-pay

## 2021-04-20 DIAGNOSIS — M79604 Pain in right leg: Secondary | ICD-10-CM

## 2021-04-20 DIAGNOSIS — R252 Cramp and spasm: Secondary | ICD-10-CM | POA: Diagnosis present

## 2021-04-20 DIAGNOSIS — R2689 Other abnormalities of gait and mobility: Secondary | ICD-10-CM | POA: Insufficient documentation

## 2021-04-20 DIAGNOSIS — M6281 Muscle weakness (generalized): Secondary | ICD-10-CM | POA: Diagnosis present
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
## 2021-04-25 ENCOUNTER — Other Ambulatory Visit: Payer: Self-pay | Admitting: Obstetrics and Gynecology

## 2021-04-25 ENCOUNTER — Ambulatory Visit: Payer: Medicaid Other | Attending: Obstetrics and Gynecology

## 2021-04-25 ENCOUNTER — Ambulatory Visit: Payer: Medicaid Other | Attending: Obstetrics | Admitting: Obstetrics

## 2021-04-25 ENCOUNTER — Encounter: Payer: Self-pay | Admitting: *Deleted

## 2021-04-25 ENCOUNTER — Other Ambulatory Visit: Payer: Self-pay

## 2021-04-25 ENCOUNTER — Ambulatory Visit: Payer: Medicaid Other | Admitting: *Deleted

## 2021-04-25 VITALS — BP 117/70 | HR 81

## 2021-04-25 DIAGNOSIS — Z362 Encounter for other antenatal screening follow-up: Secondary | ICD-10-CM | POA: Diagnosis not present

## 2021-04-25 DIAGNOSIS — Z3A22 22 weeks gestation of pregnancy: Secondary | ICD-10-CM

## 2021-04-25 DIAGNOSIS — O0990 Supervision of high risk pregnancy, unspecified, unspecified trimester: Secondary | ICD-10-CM | POA: Insufficient documentation

## 2021-04-25 DIAGNOSIS — O365990 Maternal care for other known or suspected poor fetal growth, unspecified trimester, not applicable or unspecified: Secondary | ICD-10-CM

## 2021-04-25 DIAGNOSIS — O365921 Maternal care for other known or suspected poor fetal growth, second trimester, fetus 1: Secondary | ICD-10-CM

## 2021-04-25 DIAGNOSIS — O321XX0 Maternal care for breech presentation, not applicable or unspecified: Secondary | ICD-10-CM

## 2021-04-25 DIAGNOSIS — O365920 Maternal care for other known or suspected poor fetal growth, second trimester, not applicable or unspecified: Secondary | ICD-10-CM

## 2021-04-25 NOTE — Progress Notes (Signed)
MFM Note  Sheri Perez was seen for a follow up growth scan due to fetal growth restriction noted during her prior ultrasound exams.  Her EDC was changed to August 29, 2021 following her last exam.    She denies any problems since her last exam and reports feeling vigorous fetal movements throughout the day.    On today's exam, the EFW measures at the second percentile for her gestational age indicating fetal growth restriction.  The fetus has doubled in weight since her last exam.  There was normal amniotic fluid noted.    Fetal movements were noted throughout today's exam.  Doppler studies of the umbilical arteries showed a normal S/D ratio of 3.21.  There were no signs of absent or reversed end-diastolic flow.    The implications and management of fetal growth restriction was discussed with the patient today.  She was advised regarding the increased risk of an IUFD especially if the umbilical artery Doppler studies are abnormal.  Fetal kick count instructions were reviewed.    Due to IUGR, a follow-up umbilical artery Doppler study was scheduled in 2 weeks.  We will reassess the fetal growth in 3 weeks.    The patient understands that should IUGR continue to be noted later in her pregnancy, an earlier delivery may be indicated.    The patient stated that all of her questions have been answered to her complete satisfaction.    All conversations were held with the patient today with the help of a Spanish interpreter.    A total of 20 minutes was spent counseling and coordinating the care for this patient.  Greater than 50% of the time was spent in direct face-to-face contact.

## 2021-04-26 ENCOUNTER — Other Ambulatory Visit: Payer: Self-pay | Admitting: *Deleted

## 2021-04-26 DIAGNOSIS — O365920 Maternal care for other known or suspected poor fetal growth, second trimester, not applicable or unspecified: Secondary | ICD-10-CM

## 2021-04-27 ENCOUNTER — Encounter: Payer: Medicaid Other | Admitting: Physical Therapy

## 2021-05-04 ENCOUNTER — Other Ambulatory Visit: Payer: Self-pay

## 2021-05-04 ENCOUNTER — Ambulatory Visit: Payer: Medicaid Other | Admitting: Physical Therapy

## 2021-05-04 DIAGNOSIS — R252 Cramp and spasm: Secondary | ICD-10-CM

## 2021-05-04 DIAGNOSIS — M79604 Pain in right leg: Secondary | ICD-10-CM | POA: Diagnosis not present

## 2021-05-04 DIAGNOSIS — R2689 Other abnormalities of gait and mobility: Secondary | ICD-10-CM

## 2021-05-04 NOTE — Patient Instructions (Signed)
Access Code: [email or web address] URL: [email or web address] Date: 05/04/2021 Prepared by: [facility]  Exercises Seated Scapular Retraction - 1 x daily - 7 x weekly - 2 sets - 5 reps - 45s hold Seated Hamstring Stretch - 1 x daily - 7 x weekly - 2 sets - 5 reps - 45s hold Standing Lumbar Extension - 1 x daily - 7 x weekly - 1 sets - 5 reps - 45s hold Sidelying Diaphragmatic Breathing - 1 x daily - 7 x weekly - 3 sets - 10 reps Seated Pelvic Tilt - 1 x daily - 7 x weekly - 10 reps   [facility] [address] [place], [place] [address] Phone # [phone] Fax [phone]
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
## 2021-05-05 ENCOUNTER — Encounter: Payer: Medicaid Other | Admitting: Family Medicine

## 2021-05-09 ENCOUNTER — Ambulatory Visit: Payer: Medicaid Other | Attending: Obstetrics and Gynecology | Admitting: Obstetrics and Gynecology

## 2021-05-09 ENCOUNTER — Ambulatory Visit: Payer: Medicaid Other | Admitting: *Deleted

## 2021-05-09 ENCOUNTER — Other Ambulatory Visit: Payer: Self-pay | Admitting: *Deleted

## 2021-05-09 ENCOUNTER — Other Ambulatory Visit: Payer: Self-pay

## 2021-05-09 ENCOUNTER — Ambulatory Visit: Payer: Medicaid Other | Attending: Obstetrics

## 2021-05-09 ENCOUNTER — Encounter: Payer: Self-pay | Admitting: *Deleted

## 2021-05-09 VITALS — BP 107/61 | HR 76

## 2021-05-09 DIAGNOSIS — Z3A24 24 weeks gestation of pregnancy: Secondary | ICD-10-CM

## 2021-05-09 DIAGNOSIS — O350XX0 Maternal care for (suspected) central nervous system malformation in fetus, not applicable or unspecified: Secondary | ICD-10-CM

## 2021-05-09 DIAGNOSIS — Z362 Encounter for other antenatal screening follow-up: Secondary | ICD-10-CM | POA: Diagnosis not present

## 2021-05-09 DIAGNOSIS — O0990 Supervision of high risk pregnancy, unspecified, unspecified trimester: Secondary | ICD-10-CM

## 2021-05-09 DIAGNOSIS — O365920 Maternal care for other known or suspected poor fetal growth, second trimester, not applicable or unspecified: Secondary | ICD-10-CM

## 2021-05-09 DIAGNOSIS — O365990 Maternal care for other known or suspected poor fetal growth, unspecified trimester, not applicable or unspecified: Secondary | ICD-10-CM

## 2021-05-09 DIAGNOSIS — O3509X0 Maternal care for (suspected) other central nervous system malformation or damage in fetus, not applicable or unspecified: Secondary | ICD-10-CM

## 2021-05-09 DIAGNOSIS — O321XX0 Maternal care for breech presentation, not applicable or unspecified: Secondary | ICD-10-CM

## 2021-05-09 NOTE — Progress Notes (Signed)
Maternal-Fetal Medicine   Name: Sheri Perez DOB: Mar 07, 1991 MRN: [id number] Referring Provider: [facility]  I had the pleasure of seeing [name] at the [facility]. She is G3 P2 at 8-weeks' gestation.  She is here for limited ultrasound evaluation and umbilical artery Doppler studies.  On fetal growth assessment performed 2 weeks ago the estimated fetal weight was at the 2nd percentile. Patient does not have any chronic medical conditions including hypertension.  Blood pressure today at her office is 107/61 mmHg. Obstetric history significant for 2 term vaginal deliveries.  Both children weighed 8 pounds or greater. On cell free fetal DNA screening, she had low risk for fetal aneuploidies.  Ultrasound On today's ultrasound, amniotic fluid is normal good fetal activity seen.  Umbilical artery Doppler showed normal forward diastolic flow. Unilateral mild isolated ventriculomegaly (right), measuring 11 mm, is seen.  Intracranial structures, otherwise, appear normal.  No other obvious fetal structural defects are seen. Fetal abdomen including liver appeared normal with no evidence of calcifications. I counseled the patient with help of Spanish language interpreter present in the room.  Ventriculomegaly I explained ventriculomegaly with help of ultrasound images.  Ventriculomegaly can be associated with fetal chromosomal malformations, genetic syndromes, infection and normal fetus. I reassured the patient that in most cases, isolated borderline ventriculomegaly is not associated with adverse neurodevelopmental outcomes. It is also likely that ventriculomegaly may resolve with advancing gestation. If progression is seen, we will recommend MRI to rule out structural anomalies.   I discussed amniocentesis to rule out chromosomal anomalies. Amniocentesis gives information on full karyotype and microarray can also be performed to detect single gene  disorders. Ventriculomegaly can be associated with intrauterine infections (unlikely given normal ultrasound findings).  After counseling, patient opted not to have amniocentesis.  Patient had consultation with Dr. Blake Divine at her last visit regarding fetal growth restriction.  I briefly explained the significance of antenatal testing and timing of delivery.  Recommendations -Patient has an appointment for fetal growth assessment next week. -If severe fetal growth restriction is seen, NST will be added to antenatal testing and then weekly antenatal testing till delivery.  Thank you for consultation.  If you have any questions or concerns, please contact me the [facility].  Consultation including face-to-face (more than 50%) counseling 30 minutes.

## 2021-05-11 ENCOUNTER — Ambulatory Visit: Payer: Medicaid Other | Admitting: Physical Therapy

## 2021-05-16 ENCOUNTER — Ambulatory Visit: Payer: Medicaid Other | Admitting: *Deleted

## 2021-05-16 ENCOUNTER — Encounter: Payer: Self-pay | Admitting: *Deleted

## 2021-05-16 ENCOUNTER — Other Ambulatory Visit: Payer: Self-pay

## 2021-05-16 ENCOUNTER — Ambulatory Visit: Payer: Medicaid Other | Attending: Obstetrics

## 2021-05-16 VITALS — BP 119/68 | HR 72

## 2021-05-16 DIAGNOSIS — O321XX0 Maternal care for breech presentation, not applicable or unspecified: Secondary | ICD-10-CM | POA: Diagnosis not present

## 2021-05-16 DIAGNOSIS — O0990 Supervision of high risk pregnancy, unspecified, unspecified trimester: Secondary | ICD-10-CM | POA: Diagnosis present

## 2021-05-16 DIAGNOSIS — O365920 Maternal care for other known or suspected poor fetal growth, second trimester, not applicable or unspecified: Secondary | ICD-10-CM | POA: Insufficient documentation

## 2021-05-16 DIAGNOSIS — Z3A25 25 weeks gestation of pregnancy: Secondary | ICD-10-CM | POA: Diagnosis not present

## 2021-05-18 ENCOUNTER — Ambulatory Visit: Payer: Medicaid Other | Admitting: Physical Therapy

## 2021-05-19 ENCOUNTER — Ambulatory Visit (INDEPENDENT_AMBULATORY_CARE_PROVIDER_SITE_OTHER): Payer: Medicaid Other | Admitting: Obstetrics & Gynecology

## 2021-05-19 ENCOUNTER — Other Ambulatory Visit: Payer: Self-pay

## 2021-05-19 VITALS — BP 121/68 | HR 75 | Wt 167.7 lb

## 2021-05-19 DIAGNOSIS — O365990 Maternal care for other known or suspected poor fetal growth, unspecified trimester, not applicable or unspecified: Secondary | ICD-10-CM

## 2021-05-19 DIAGNOSIS — O0990 Supervision of high risk pregnancy, unspecified, unspecified trimester: Secondary | ICD-10-CM

## 2021-05-19 MED ORDER — DOCUSATE SODIUM 100 MG PO CAPS: 100.0000 mg | ORAL_CAPSULE | Freq: Two times a day (BID) | ORAL | 0 refills | Status: DC

## 2021-05-19 MED ORDER — POLYETHYLENE GLYCOL 3350 17 GM/SCOOP PO POWD: 17.0000 g | Freq: Every day | ORAL | 0 refills | Status: DC

## 2021-05-19 MED ORDER — CLINDAMYCIN PHOSPHATE 1 % EX GEL: Freq: Two times a day (BID) | CUTANEOUS | 11 refills | Status: DC

## 2021-05-19 NOTE — Progress Notes (Signed)
   PRENATAL VISIT NOTE  Subjective:  Sheri Perez is a 30 y.o. G3P2002 at [redacted]w[redacted]d being seen today for ongoing prenatal care.  She is currently monitored for the following issues for this high-risk pregnancy and has Family history of diabetes mellitus in father; Language barrier; Back pain due to injury; Globus sensation; Dyspepsia; Supervision of high risk pregnancy, antepartum; Back pain; and IUGR (intrauterine growth restriction) affecting care of mother on their problem list.  Patient reports nausea, vomiting, and constipation .   .  .   . Denies leaking of fluid.   The following portions of the patient's history were reviewed and updated as appropriate: allergies, current medications, past family history, past medical history, past social history, past surgical history and problem list.   Objective:  BP cuff not working; Charity fundraiser aware and finding another cuff.   Fetal Status:           General:  Alert, oriented and cooperative. Patient is in no acute distress.  Skin: Skin is warm and dry. No rash noted.   Cardiovascular: Normal heart rate noted  Respiratory: Normal respiratory effort, no problems with respiration noted  Abdomen: Soft, gravid, appropriate for gestational age.        Pelvic: Cervical exam deferred        Extremities: Normal range of motion.     Mental Status: Normal mood and affect. Normal behavior. Normal judgment and thought content.   Assessment and Plan:  Pregnancy: G3P2002 at [redacted]w[redacted]d 1. Poor fetal growth affecting management of mother, antepartum, single or unspecified fetus This patient was seen for a follow up growth scan due to fetal  growth restriction noted during her prior ultrasound exams.  She denies any problems since her last exam and reports  feeling vigorous fetal movements throughout the day.  Mild  ventriculomegaly was noted in the fetal brain on her last  exam.  On today's exam, the EFW measures at the second  percentile for her gestational  age indicating fetal growth  restriction.  The fetus has grown about half a pound over the  past 3 weeks.  There was normal amniotic fluid noted.  Fetal movements were noted throughout today's exam.  Doppler studies of the umbilical arteries showed a normal  S/D ratio of 2.79.  There were no signs of absent or reversed  end-diastolic flow.  The previously noted ventriculomegaly was not noted in the  fetal brain today.  The patient was reassured by today's  findings.  Due to fetal growth restriction, another umbilical artery  Doppler study and amniotic fluid check was scheduled in 2  weeks. ----------------------------------------------------------------------  2. Supervision of high risk pregnancy, antepartum Restart zofran and add colace and prn Miralax  Preterm labor symptoms and general obstetric precautions including but not limited to vaginal bleeding, contractions, leaking of fluid and fetal movement were reviewed in detail with the patient. Please refer to After Visit Summary for other counseling recommendations.   No follow-ups on file.  Future Appointments  Date Time Provider Department Center  05/31/2021 12:30 PM [facility] NURSE [facility]  05/31/2021 12:45 PM [facility] US4 [facility]  06/07/2021  1:00 PM [facility] NURSE [facility]  06/07/2021  1:15 PM [facility]2 [facility]    Elsie Lincoln, MD

## 2021-05-25 ENCOUNTER — Encounter: Payer: Medicaid Other | Admitting: Physical Therapy

## 2021-05-31 ENCOUNTER — Other Ambulatory Visit: Payer: Self-pay | Admitting: *Deleted

## 2021-05-31 ENCOUNTER — Other Ambulatory Visit: Payer: Self-pay

## 2021-05-31 ENCOUNTER — Encounter: Payer: Self-pay | Admitting: *Deleted

## 2021-05-31 ENCOUNTER — Ambulatory Visit: Payer: Medicaid Other | Admitting: *Deleted

## 2021-05-31 ENCOUNTER — Ambulatory Visit: Payer: Medicaid Other | Attending: Obstetrics and Gynecology

## 2021-05-31 VITALS — BP 112/67 | HR 76

## 2021-05-31 DIAGNOSIS — Z3A27 27 weeks gestation of pregnancy: Secondary | ICD-10-CM

## 2021-05-31 DIAGNOSIS — O365990 Maternal care for other known or suspected poor fetal growth, unspecified trimester, not applicable or unspecified: Secondary | ICD-10-CM | POA: Diagnosis present

## 2021-05-31 DIAGNOSIS — O0990 Supervision of high risk pregnancy, unspecified, unspecified trimester: Secondary | ICD-10-CM | POA: Diagnosis present

## 2021-05-31 DIAGNOSIS — O365920 Maternal care for other known or suspected poor fetal growth, second trimester, not applicable or unspecified: Secondary | ICD-10-CM | POA: Diagnosis not present

## 2021-05-31 DIAGNOSIS — O321XX0 Maternal care for breech presentation, not applicable or unspecified: Secondary | ICD-10-CM

## 2021-05-31 DIAGNOSIS — O365930 Maternal care for other known or suspected poor fetal growth, third trimester, not applicable or unspecified: Secondary | ICD-10-CM

## 2021-06-06 ENCOUNTER — Other Ambulatory Visit: Payer: Medicaid Other

## 2021-06-06 ENCOUNTER — Other Ambulatory Visit: Payer: Self-pay | Admitting: General Practice

## 2021-06-06 ENCOUNTER — Ambulatory Visit (INDEPENDENT_AMBULATORY_CARE_PROVIDER_SITE_OTHER): Payer: Medicaid Other | Admitting: Obstetrics and Gynecology

## 2021-06-06 ENCOUNTER — Other Ambulatory Visit: Payer: Self-pay

## 2021-06-06 VITALS — BP 115/77 | HR 77 | Wt 169.0 lb

## 2021-06-06 DIAGNOSIS — O365930 Maternal care for other known or suspected poor fetal growth, third trimester, not applicable or unspecified: Secondary | ICD-10-CM

## 2021-06-06 DIAGNOSIS — O0990 Supervision of high risk pregnancy, unspecified, unspecified trimester: Secondary | ICD-10-CM

## 2021-06-06 DIAGNOSIS — Z789 Other specified health status: Secondary | ICD-10-CM

## 2021-06-06 DIAGNOSIS — Z3A28 28 weeks gestation of pregnancy: Secondary | ICD-10-CM | POA: Insufficient documentation

## 2021-06-06 NOTE — Progress Notes (Signed)
   PRENATAL VISIT NOTE  Subjective:  Sheri Perez is a 30 y.o. G3P2002 at [redacted]w[redacted]d being seen today for ongoing prenatal care.  She is currently monitored for the following issues for this high-risk pregnancy and has Family history of diabetes mellitus in father; Language barrier; Back pain due to injury; Globus sensation; Dyspepsia; Supervision of high risk pregnancy, antepartum; Back pain; IUGR (intrauterine growth restriction) affecting care of mother; and [redacted] weeks gestation of pregnancy on their problem list.  Patient doing well with no acute concerns today. She reports no complaints.  Contractions: Irritability. Vag. Bleeding: None.  Movement: Present. Denies leaking of fluid.   The following portions of the patient's history were reviewed and updated as appropriate: allergies, current medications, past family history, past medical history, past social history, past surgical history and problem list. Problem list updated.  Objective:   Vitals:   06/06/21 0832  BP: 115/77  Pulse: 77  Weight: 169 lb (76.7 kg)    Fetal Status: Fetal Heart Rate (bpm): 160   Movement: Present     General:  Alert, oriented and cooperative. Patient is in no acute distress.  Skin: Skin is warm and dry. No rash noted.   Cardiovascular: Normal heart rate noted  Respiratory: Normal respiratory effort, no problems with respiration noted  Abdomen: Soft, gravid, appropriate for gestational age.  Pain/Pressure: Present     Pelvic: Cervical exam deferred        Extremities: Normal range of motion.  Edema: Trace  Mental Status:  Normal mood and affect. Normal behavior. Normal judgment and thought content.   Assessment and Plan:  Pregnancy: G3P2002 at [redacted]w[redacted]d  1. [redacted] weeks gestation of pregnancy   2. Language barrier Interpreter present  3. Supervision of high risk pregnancy, antepartum Continue routine care, third trimester labs and 2 hour GTT today  4. Poor fetal growth affecting management of  mother in third trimester, single or unspecified fetus Currently pt has fetal dopplers and growth scan scheduled  Preterm labor symptoms and general obstetric precautions including but not limited to vaginal bleeding, contractions, leaking of fluid and fetal movement were reviewed in detail with the patient.  Please refer to After Visit Summary for other counseling recommendations.   Return in about 2 weeks (around 06/20/2021) for [facility], in person.   Mariel Aloe, MD Faculty Attending [facility]

## 2021-06-07 ENCOUNTER — Other Ambulatory Visit: Payer: Self-pay | Admitting: Maternal & Fetal Medicine

## 2021-06-07 ENCOUNTER — Encounter: Payer: Self-pay | Admitting: *Deleted

## 2021-06-07 ENCOUNTER — Ambulatory Visit: Payer: Medicaid Other | Admitting: *Deleted

## 2021-06-07 ENCOUNTER — Ambulatory Visit: Payer: Medicaid Other | Attending: Obstetrics and Gynecology

## 2021-06-07 VITALS — BP 120/65 | HR 75

## 2021-06-07 DIAGNOSIS — O321XX0 Maternal care for breech presentation, not applicable or unspecified: Secondary | ICD-10-CM | POA: Diagnosis not present

## 2021-06-07 DIAGNOSIS — Z362 Encounter for other antenatal screening follow-up: Secondary | ICD-10-CM

## 2021-06-07 DIAGNOSIS — Z3A28 28 weeks gestation of pregnancy: Secondary | ICD-10-CM | POA: Diagnosis not present

## 2021-06-07 DIAGNOSIS — O0990 Supervision of high risk pregnancy, unspecified, unspecified trimester: Secondary | ICD-10-CM

## 2021-06-07 DIAGNOSIS — O365930 Maternal care for other known or suspected poor fetal growth, third trimester, not applicable or unspecified: Secondary | ICD-10-CM

## 2021-06-07 DIAGNOSIS — O365990 Maternal care for other known or suspected poor fetal growth, unspecified trimester, not applicable or unspecified: Secondary | ICD-10-CM | POA: Insufficient documentation

## 2021-06-07 LAB — GLUCOSE TOLERANCE, 2 HOURS W/ 1HR
Glucose, 1 hour: 181 mg/dL — ABNORMAL HIGH (ref 65–179)
Glucose, 2 hour: 135 mg/dL (ref 65–152)
Glucose, Fasting: 95 mg/dL — ABNORMAL HIGH (ref 65–91)

## 2021-06-07 LAB — RPR: RPR Ser Ql: NONREACTIVE

## 2021-06-07 LAB — HIV ANTIBODY (ROUTINE TESTING W REFLEX): HIV Screen 4th Generation wRfx: NONREACTIVE

## 2021-06-07 LAB — CBC
Hematocrit: 37 % (ref 34.0–46.6)
Hemoglobin: 12.4 g/dL (ref 11.1–15.9)
MCH: 31.9 pg (ref 26.6–33.0)
MCHC: 33.5 g/dL (ref 31.5–35.7)
MCV: 95 fL (ref 79–97)
Platelets: 177 10*3/uL (ref 150–450)
RBC: 3.89 x10E6/uL (ref 3.77–5.28)
RDW: 12.1 % (ref 11.7–15.4)
WBC: 7.7 10*3/uL (ref 3.4–10.8)

## 2021-06-08 ENCOUNTER — Telehealth: Payer: Self-pay | Admitting: *Deleted

## 2021-06-08 NOTE — Telephone Encounter (Signed)
Patient called and asked if it is ok for her to receive Kenalog injection from her neurologist.  I discussed with Dr. Shawnie Pons and she may have a one time injection but if more needed, will need to discuss with provider at next ob visit.  I called Verdine with Interpre[employer] Royal and she states is 7 injections. I explained she will need to wait and discuss with provider at next ob visit. She voices understanding.  Pius Byrom,RN

## 2021-06-08 NOTE — Telephone Encounter (Signed)
Per further discussion Dr.Pratt states patient may have the injections if every months. Called patient with Interpre[employer] Royal and informed her she may have the injections every 3  months but recommend she discuss in more detail at her next ob visit with provider. She voices understanding. Sophiya Morello,RN

## 2021-06-09 ENCOUNTER — Telehealth: Payer: Self-pay | Admitting: *Deleted

## 2021-06-09 DIAGNOSIS — O24419 Gestational diabetes mellitus in pregnancy, unspecified control: Secondary | ICD-10-CM

## 2021-06-09 NOTE — Telephone Encounter (Signed)
I called Corrisa with [employer] and gave her results and recommendations per Dr. Donavan Foil. I offered her education appointments and she agreed to 06/14/21 0915 and appointment scheduled. Chastidy Ranker,RN

## 2021-06-09 NOTE — Telephone Encounter (Signed)
-----   Message from Warden Fillers, MD sent at 06/08/2021  9:52 PM EDT ----- Pt failed 2 hour GTT, will schedule with diabetic teaching

## 2021-06-14 ENCOUNTER — Telehealth: Payer: Self-pay

## 2021-06-14 ENCOUNTER — Ambulatory Visit: Payer: Medicaid Other | Admitting: Registered"

## 2021-06-14 ENCOUNTER — Other Ambulatory Visit: Payer: Self-pay | Admitting: *Deleted

## 2021-06-14 ENCOUNTER — Other Ambulatory Visit: Payer: Self-pay | Admitting: Family Medicine

## 2021-06-14 ENCOUNTER — Other Ambulatory Visit: Payer: Self-pay

## 2021-06-14 ENCOUNTER — Encounter: Payer: Medicaid Other | Attending: Obstetrics and Gynecology | Admitting: Registered"

## 2021-06-14 DIAGNOSIS — O24419 Gestational diabetes mellitus in pregnancy, unspecified control: Secondary | ICD-10-CM | POA: Diagnosis present

## 2021-06-14 DIAGNOSIS — Z3A00 Weeks of gestation of pregnancy not specified: Secondary | ICD-10-CM | POA: Insufficient documentation

## 2021-06-14 MED ORDER — ENSURE HIGH PROTEIN PO LIQD: 1.0000 | Freq: Two times a day (BID) | ORAL | 0 refills | Status: DC

## 2021-06-14 MED ORDER — ACCU-CHEK SOFTCLIX LANCETS MISC: 12 refills | Status: DC

## 2021-06-14 MED ORDER — ENSURE HIGH PROTEIN PO LIQD: 1.0000 | Freq: Two times a day (BID) | ORAL | 2 refills | Status: DC

## 2021-06-14 MED ORDER — GLUCOSE BLOOD VI STRP: ORAL_STRIP | 12 refills | Status: DC
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
## 2021-06-14 NOTE — Patient Instructions (Signed)
You are not getting enough protein in your diet. Since you are not able to tolerate breakfast, consider drinking a protein nutritional supplement such as Ensure to help get the nutrition you need.

## 2021-06-14 NOTE — Telephone Encounter (Signed)
Patient called in wanting to know if Marylene Land or nurse could send in a Rx to [facility] so she could get the protein drink (ensure)     Please call patient and advise

## 2021-06-14 NOTE — Addendum Note (Signed)
Addended by: Jill Side on: 06/14/2021 04:47 PM   Modules accepted: Orders

## 2021-06-15 ENCOUNTER — Telehealth: Payer: Self-pay | Admitting: Registered"

## 2021-06-15 NOTE — Telephone Encounter (Signed)
Faxed Rx for Ensure high protein to the [facility] office. I called patient using [employer] Marily Memos [phone] to let her know that has been done.

## 2021-06-16 ENCOUNTER — Other Ambulatory Visit: Payer: Self-pay

## 2021-06-16 MED ORDER — ACCU-CHEK SOFTCLIX LANCETS MISC: 12 refills | Status: DC

## 2021-06-16 NOTE — Telephone Encounter (Signed)
Patient notified with Spanish interpreter Eda R.  That her lancets have been sent to her [facility] pharmacy on [address]  Pt verbalized understanding.   Addison Naegeli, RN  06/16/21

## 2021-06-21 ENCOUNTER — Other Ambulatory Visit: Payer: Self-pay

## 2021-06-21 ENCOUNTER — Ambulatory Visit: Payer: Medicaid Other | Admitting: Registered"

## 2021-06-21 ENCOUNTER — Other Ambulatory Visit (INDEPENDENT_AMBULATORY_CARE_PROVIDER_SITE_OTHER): Payer: Medicaid Other | Admitting: Nurse Practitioner

## 2021-06-21 ENCOUNTER — Encounter: Payer: Medicaid Other | Admitting: Family Medicine

## 2021-06-21 ENCOUNTER — Encounter: Payer: Medicaid Other | Attending: Obstetrics and Gynecology | Admitting: Registered"

## 2021-06-21 DIAGNOSIS — O24419 Gestational diabetes mellitus in pregnancy, unspecified control: Secondary | ICD-10-CM | POA: Insufficient documentation

## 2021-06-21 DIAGNOSIS — Z3A00 Weeks of gestation of pregnancy not specified: Secondary | ICD-10-CM | POA: Diagnosis not present

## 2021-06-21 MED ORDER — DOXYLAMINE-PYRIDOXINE 10-10 MG PO TBEC
DELAYED_RELEASE_TABLET | ORAL | 2 refills | Status: DC
Start: 2021-06-21 — End: 2021-08-03

## 2021-06-21 NOTE — Progress Notes (Signed)
In-person interpreter Raquel from [facility]  Patient was seen for Gestational Diabetes self-management on 06/21/21  Start time 0930 and End time 1010  Estimated due date: 08/29/21; [redacted]w[redacted]d  Clinical: Medications: prenatal; not taking meds for nausea & constipation Medical History: no history of GDM Labs: OGTT elevated FBS, 1 hr, A1c 5.6% on 01/26/21    Dietary and Lifestyle History: Pt states she continues to have daily vomiting. On Sept 3 and 4 patient reports that she checked blood sugar after vomiting and the other days has checked before vomiting.  Physical Activity: has been a good week, not as much pain, and has been going to park with children Stress: Sleep:  24 hr Recall: First Meal: Ensure Snack:  Second meal: burger with 1/2 bun, 5 fries, water Snack: mandarin Third meal: beef stew, squash, carrots, 1 tortilla,  Snack: none Beverages: water, guava juice one day  NUTRITION INTERVENTION  Nutrition education (E-1) on the following topics:   Initial Follow-up  [x]  []  Definition of Gestational Diabetes [x]  []  Why dietary management is important in controlling blood glucose [x]  []  Effects each nutrient has on blood glucose levels []  []  Simple carbohydrates vs complex carbohydrates []  []  Fluid intake [x]  []  Creating a balanced meal plan []  []  Carbohydrate counting  [x]  [x]  When to check blood glucose levels [x]  []  Proper blood glucose monitoring techniques [x]  []  Effect of stress and stress reduction techniques  []  [x]  Exercise effect on blood glucose levels, appropriate exercise during pregnancy []  [x]  Importance of limiting caffeine and abstaining from alcohol and smoking []  []  Medications used for blood sugar control during pregnancy []  []  Hypoglycemia and rule of 15 []  [x]  Postpartum self care   Patient instructed to monitor glucose levels: FBS: 60 - ? 95 mg/dL (some clinics use 90 for cutoff) 1 hour: ? 140 mg/dL 2 hour: ? mg/dL  Patient received  handouts: Nutrition Diabetes and Pregnancy Carbohydrate Counting List  Patient will be seen for follow-up as needed.

## 2021-06-21 NOTE — Progress Notes (Signed)
CC; vomiting throughout pregnancy Sheri Perez 30 y.o. [redacted]w[redacted]d  S:  Not taking anything now for vomiting.  Having blood in her vomitus for 3 days A:  persistent morning sickness P: Will prescribe Diclegis. Will keep her appointment for later in the week

## 2021-06-22 ENCOUNTER — Ambulatory Visit: Payer: Medicaid Other | Admitting: *Deleted

## 2021-06-22 ENCOUNTER — Ambulatory Visit (HOSPITAL_BASED_OUTPATIENT_CLINIC_OR_DEPARTMENT_OTHER): Payer: Medicaid Other | Admitting: *Deleted

## 2021-06-22 ENCOUNTER — Encounter: Payer: Self-pay | Admitting: Family Medicine

## 2021-06-22 ENCOUNTER — Ambulatory Visit (INDEPENDENT_AMBULATORY_CARE_PROVIDER_SITE_OTHER): Payer: Medicaid Other | Admitting: Family Medicine

## 2021-06-22 ENCOUNTER — Other Ambulatory Visit: Payer: Self-pay

## 2021-06-22 ENCOUNTER — Encounter: Payer: Self-pay | Admitting: *Deleted

## 2021-06-22 ENCOUNTER — Ambulatory Visit: Payer: Medicaid Other | Attending: Maternal & Fetal Medicine

## 2021-06-22 VITALS — BP 114/74 | HR 74 | Wt 168.1 lb

## 2021-06-22 VITALS — BP 112/65 | HR 82

## 2021-06-22 DIAGNOSIS — Z3A30 30 weeks gestation of pregnancy: Secondary | ICD-10-CM | POA: Diagnosis present

## 2021-06-22 DIAGNOSIS — O0990 Supervision of high risk pregnancy, unspecified, unspecified trimester: Secondary | ICD-10-CM

## 2021-06-22 DIAGNOSIS — O24410 Gestational diabetes mellitus in pregnancy, diet controlled: Secondary | ICD-10-CM

## 2021-06-22 DIAGNOSIS — O365930 Maternal care for other known or suspected poor fetal growth, third trimester, not applicable or unspecified: Secondary | ICD-10-CM | POA: Diagnosis present

## 2021-06-22 DIAGNOSIS — M549 Dorsalgia, unspecified: Secondary | ICD-10-CM

## 2021-06-22 DIAGNOSIS — Z23 Encounter for immunization: Secondary | ICD-10-CM

## 2021-06-22 DIAGNOSIS — Z789 Other specified health status: Secondary | ICD-10-CM

## 2021-06-22 NOTE — Progress Notes (Signed)
   Subjective:  Sheri Perez is a 30 y.o. G3P2002 at [redacted]w[redacted]d being seen today for ongoing prenatal care.  She is currently monitored for the following issues for this high-risk pregnancy and has Family history of diabetes mellitus in father; Language barrier; Back pain due to injury; Globus sensation; Dyspepsia; Supervision of high risk pregnancy, antepartum; Back pain; IUGR (intrauterine growth restriction) affecting care of mother; [redacted] weeks gestation of pregnancy; and Gestational diabetes mellitus (GDM), antepartum on their problem list.  Patient reports no complaints.  Contractions: Not present. Vag. Bleeding: None.  Movement: Present. Denies leaking of fluid.   The following portions of the patient's history were reviewed and updated as appropriate: allergies, current medications, past family history, past medical history, past social history, past surgical history and problem list. Problem list updated.  Objective:   Vitals:   06/22/21 1454  BP: 114/74  Pulse: 74  Weight: 168 lb 1.6 oz (76.2 kg)    Fetal Status: Fetal Heart Rate (bpm): 158   Movement: Present     General:  Alert, oriented and cooperative. Patient is in no acute distress.  Skin: Skin is warm and dry. No rash noted.   Cardiovascular: Normal heart rate noted  Respiratory: Normal respiratory effort, no problems with respiration noted  Abdomen: Soft, gravid, appropriate for gestational age. Pain/Pressure: Present     Pelvic: Vag. Bleeding: None     Cervical exam deferred        Extremities: Normal range of motion.  Edema: Trace  Mental Status: Normal mood and affect. Normal behavior. Normal judgment and thought content.    Urinalysis:      Assessment and Plan:  Pregnancy: G3P2002 at [redacted]w[redacted]d  1. Supervision of high risk pregnancy, antepartum BP and FHR normal Accepts TDaP today, declines flu until next visit - Tdap vaccine greater than or equal to 7yo IM  2. Diet controlled gestational diabetes mellitus  (GDM), antepartum 75% at goal No meds at this time Following w MFM  3. Language barrier Spanish  4. Poor fetal growth affecting management of mother in third trimester, single or unspecified fetus IUGR 5% on last growth [place] Following w MFM for cord dopplers, normal to date  5. Back pain due to injury Reports she has not been getting steroid injections in her neck due to conflicting answers from OB's and Neurologists I discussed there is no contraindication because of pregnancy, but due to her GDM I would recommend alternative treatments until she delivers Provided letter stating such per her request  Preterm labor symptoms and general obstetric precautions including but not limited to vaginal bleeding, contractions, leaking of fluid and fetal movement were reviewed in detail with the patient. Please refer to After Visit Summary for other counseling recommendations.  Return in 2 weeks (on 07/06/2021) for [facility], ob visit.   Venora Maples, MD

## 2021-06-22 NOTE — Patient Instructions (Signed)

## 2021-06-22 NOTE — Procedures (Signed)
Sheri Perez 24-Nov-1990 [redacted]w[redacted]d  Fetus A Non-Stress Test Interpretation for 06/22/21  Indication: IUGR  Fetal Heart Rate A Mode: External Baseline Rate (A): 150 bpm Variability: Moderate Accelerations: 15 x 15 Decelerations: None Multiple birth?: No  Uterine Activity Mode: Palpation, Toco Contraction Frequency (min): none Resting Tone Palpated: Relaxed  Interpretation (Fetal Testing) Nonstress Test Interpretation: Reactive Overall Impression: Reassuring for gestational age Comments: Dr. Judeth Cornfield reviewed tracing

## 2021-06-28 ENCOUNTER — Ambulatory Visit (HOSPITAL_BASED_OUTPATIENT_CLINIC_OR_DEPARTMENT_OTHER): Payer: Medicaid Other | Admitting: *Deleted

## 2021-06-28 ENCOUNTER — Other Ambulatory Visit: Payer: Self-pay | Admitting: Lactation Services

## 2021-06-28 ENCOUNTER — Ambulatory Visit: Payer: Medicaid Other | Admitting: *Deleted

## 2021-06-28 ENCOUNTER — Ambulatory Visit: Payer: Medicaid Other | Attending: Maternal & Fetal Medicine

## 2021-06-28 ENCOUNTER — Other Ambulatory Visit: Payer: Self-pay

## 2021-06-28 ENCOUNTER — Encounter: Payer: Self-pay | Admitting: *Deleted

## 2021-06-28 VITALS — BP 114/67 | HR 76

## 2021-06-28 DIAGNOSIS — O365931 Maternal care for other known or suspected poor fetal growth, third trimester, fetus 1: Secondary | ICD-10-CM | POA: Diagnosis present

## 2021-06-28 DIAGNOSIS — O0990 Supervision of high risk pregnancy, unspecified, unspecified trimester: Secondary | ICD-10-CM | POA: Diagnosis present

## 2021-06-28 DIAGNOSIS — Z3A31 31 weeks gestation of pregnancy: Secondary | ICD-10-CM | POA: Diagnosis not present

## 2021-06-28 DIAGNOSIS — O24410 Gestational diabetes mellitus in pregnancy, diet controlled: Secondary | ICD-10-CM

## 2021-06-28 DIAGNOSIS — O219 Vomiting of pregnancy, unspecified: Secondary | ICD-10-CM

## 2021-06-28 DIAGNOSIS — O365930 Maternal care for other known or suspected poor fetal growth, third trimester, not applicable or unspecified: Secondary | ICD-10-CM | POA: Insufficient documentation

## 2021-06-28 MED ORDER — PROMETHAZINE HCL 25 MG PO TABS: 25.0000 mg | ORAL_TABLET | Freq: Four times a day (QID) | ORAL | 0 refills | Status: DC | PRN

## 2021-06-28 NOTE — Procedures (Signed)
Sheri Perez 1991/03/08 [redacted]w[redacted]d  Fetus A Non-Stress Test Interpretation for 06/28/21  Indication: IUGR  Fetal Heart Rate A Mode: External Baseline Rate (A): 145 bpm Variability: Moderate Accelerations: 15 x 15 Decelerations: None Multiple birth?: No  Uterine Activity Mode: Palpation, Toco Contraction Frequency (min): none Resting Tone Palpated: Relaxed Resting Time: Adequate  Interpretation (Fetal Testing) Nonstress Test Interpretation: Reactive Overall Impression: Reassuring for gestational age Comments: Dr. Parke Poisson reviewed tracing

## 2021-06-28 NOTE — Progress Notes (Signed)
Patient stopped in the office after MFM appointment to discuss medication issues.   She reports:   Phenergan makes her sleepy and she needs a refill Needs refill on Clindamycin GEl CAnnot afford Diclegis  Called [facility] on [address] and asked them run Diclegis as brand and they did report it was covered and would be ready about 4 pm tomorrow as they need to order.  Patient with Clindamycin gel prescriptions at [facility] Pharmacy, asked [facility] [address] to get ready also. Good RX coupon given to patient at her request. Reviewed with patient that Phenergan is known to make a patient sleepy. Reordered prescription at patients request.   Informed patient of all the above with assistance of Karl Luke, Spanish Interpreter. Patient voiced understanding to all the above, she will call with any questions or concerns as needed.

## 2021-06-29 ENCOUNTER — Other Ambulatory Visit: Payer: Self-pay | Admitting: *Deleted

## 2021-06-29 DIAGNOSIS — O365930 Maternal care for other known or suspected poor fetal growth, third trimester, not applicable or unspecified: Secondary | ICD-10-CM

## 2021-07-06 ENCOUNTER — Ambulatory Visit: Payer: Medicaid Other | Attending: Maternal & Fetal Medicine

## 2021-07-06 ENCOUNTER — Encounter: Payer: Self-pay | Admitting: *Deleted

## 2021-07-06 ENCOUNTER — Ambulatory Visit: Payer: Medicaid Other | Admitting: *Deleted

## 2021-07-06 ENCOUNTER — Other Ambulatory Visit: Payer: Self-pay

## 2021-07-06 ENCOUNTER — Ambulatory Visit (HOSPITAL_BASED_OUTPATIENT_CLINIC_OR_DEPARTMENT_OTHER): Payer: Medicaid Other | Admitting: *Deleted

## 2021-07-06 VITALS — BP 107/63 | HR 67

## 2021-07-06 DIAGNOSIS — O0990 Supervision of high risk pregnancy, unspecified, unspecified trimester: Secondary | ICD-10-CM

## 2021-07-06 DIAGNOSIS — O24410 Gestational diabetes mellitus in pregnancy, diet controlled: Secondary | ICD-10-CM | POA: Diagnosis not present

## 2021-07-06 DIAGNOSIS — O365930 Maternal care for other known or suspected poor fetal growth, third trimester, not applicable or unspecified: Secondary | ICD-10-CM | POA: Diagnosis not present

## 2021-07-06 DIAGNOSIS — Z3A32 32 weeks gestation of pregnancy: Secondary | ICD-10-CM

## 2021-07-06 NOTE — Procedures (Signed)
Sheri Perez 01-29-1991 [redacted]w[redacted]d  Fetus A Non-Stress Test Interpretation for 07/06/21  Indication: IUGR  Fetal Heart Rate A Mode: External Baseline Rate (A): 140 bpm Variability: Moderate Accelerations: 15 x 15 Decelerations: None Multiple birth?: No  Uterine Activity Mode: Palpation, Toco Contraction Frequency (min): none Resting Tone Palpated: Relaxed  Interpretation (Fetal Testing) Nonstress Test Interpretation: Reactive Comments: Dr. Grace Bushy reviewed tracing

## 2021-07-08 ENCOUNTER — Other Ambulatory Visit: Payer: Self-pay | Admitting: Maternal & Fetal Medicine

## 2021-07-08 DIAGNOSIS — O365930 Maternal care for other known or suspected poor fetal growth, third trimester, not applicable or unspecified: Secondary | ICD-10-CM

## 2021-07-12 ENCOUNTER — Other Ambulatory Visit: Payer: Self-pay

## 2021-07-12 ENCOUNTER — Encounter: Payer: Self-pay | Admitting: *Deleted

## 2021-07-12 ENCOUNTER — Ambulatory Visit: Payer: Medicaid Other | Attending: Maternal & Fetal Medicine

## 2021-07-12 ENCOUNTER — Other Ambulatory Visit: Payer: Self-pay | Admitting: Maternal & Fetal Medicine

## 2021-07-12 ENCOUNTER — Ambulatory Visit (HOSPITAL_BASED_OUTPATIENT_CLINIC_OR_DEPARTMENT_OTHER): Payer: Medicaid Other | Admitting: *Deleted

## 2021-07-12 ENCOUNTER — Ambulatory Visit: Payer: Medicaid Other | Admitting: *Deleted

## 2021-07-12 ENCOUNTER — Other Ambulatory Visit: Payer: Self-pay | Admitting: *Deleted

## 2021-07-12 VITALS — BP 111/64 | HR 75

## 2021-07-12 DIAGNOSIS — Z3A33 33 weeks gestation of pregnancy: Secondary | ICD-10-CM | POA: Diagnosis not present

## 2021-07-12 DIAGNOSIS — O24410 Gestational diabetes mellitus in pregnancy, diet controlled: Secondary | ICD-10-CM | POA: Diagnosis not present

## 2021-07-12 DIAGNOSIS — O365930 Maternal care for other known or suspected poor fetal growth, third trimester, not applicable or unspecified: Secondary | ICD-10-CM | POA: Insufficient documentation

## 2021-07-12 DIAGNOSIS — O0990 Supervision of high risk pregnancy, unspecified, unspecified trimester: Secondary | ICD-10-CM | POA: Insufficient documentation

## 2021-07-12 DIAGNOSIS — O365990 Maternal care for other known or suspected poor fetal growth, unspecified trimester, not applicable or unspecified: Secondary | ICD-10-CM

## 2021-07-12 NOTE — Procedures (Signed)
Sheri Perez 11/10/1990 [redacted]w[redacted]d  Fetus A Non-Stress Test Interpretation for 07/12/21  Indication: IUGR  Fetal Heart Rate A Mode: External Baseline Rate (A): 150 bpm Variability: Moderate Accelerations: 15 x 15 Decelerations: None Multiple birth?: No  Uterine Activity Mode: Palpation, Toco Contraction Frequency (min): Occas UI Contraction Quality: Mild Resting Tone Palpated: Relaxed Resting Time: Adequate  Interpretation (Fetal Testing) Nonstress Test Interpretation: Reactive Comments: Dr. Judeth Cornfield reviewed tracing.

## 2021-07-13 ENCOUNTER — Ambulatory Visit (INDEPENDENT_AMBULATORY_CARE_PROVIDER_SITE_OTHER): Payer: Medicaid Other | Admitting: Family Medicine

## 2021-07-13 VITALS — BP 113/75 | HR 71 | Wt 166.6 lb

## 2021-07-13 DIAGNOSIS — O365930 Maternal care for other known or suspected poor fetal growth, third trimester, not applicable or unspecified: Secondary | ICD-10-CM

## 2021-07-13 DIAGNOSIS — O24410 Gestational diabetes mellitus in pregnancy, diet controlled: Secondary | ICD-10-CM

## 2021-07-13 DIAGNOSIS — Z23 Encounter for immunization: Secondary | ICD-10-CM

## 2021-07-13 DIAGNOSIS — Z789 Other specified health status: Secondary | ICD-10-CM

## 2021-07-13 DIAGNOSIS — O0990 Supervision of high risk pregnancy, unspecified, unspecified trimester: Secondary | ICD-10-CM | POA: Diagnosis not present

## 2021-07-13 NOTE — Progress Notes (Signed)
   PRENATAL VISIT NOTE  Subjective:  Sheri Perez is a 30 y.o. G3P2002 at [redacted]w[redacted]d being seen today for ongoing prenatal care.  She is currently monitored for the following issues for this high-risk pregnancy and has Family history of diabetes mellitus in father; Language barrier; Back pain due to injury; Globus sensation; Dyspepsia; Supervision of high risk pregnancy, antepartum; Back pain; IUGR (intrauterine growth restriction) affecting care of mother; and Gestational diabetes mellitus (GDM), antepartum on their problem list.  Patient reports no complaints.  Contractions: Irritability. Vag. Bleeding: None.  Movement: Present. Denies leaking of fluid.   The following portions of the patient's history were reviewed and updated as appropriate: allergies, current medications, past family history, past medical history, past social history, past surgical history and problem list.   Objective:   Vitals:   07/13/21 1013  BP: 113/75  Pulse: 71  Weight: 166 lb 9.6 oz (75.6 kg)    Fetal Status: Fetal Heart Rate (bpm): 158 Fundal Height: 33 cm Movement: Present     General:  Alert, oriented and cooperative. Patient is in no acute distress.  Skin: Skin is warm and dry. No rash noted.   Cardiovascular: Normal heart rate noted  Respiratory: Normal respiratory effort, no problems with respiration noted  Abdomen: Soft, gravid, appropriate for gestational age.  Pain/Pressure: Present     Pelvic: Cervical exam deferred        Extremities: Normal range of motion.  Edema: None  Mental Status: Normal mood and affect. Normal behavior. Normal judgment and thought content.   Assessment and Plan:  Pregnancy: G3P2002 at [redacted]w[redacted]d 1. Poor fetal growth affecting management of mother in third trimester, single or unspecified fetus In antepartum testing with MFM for Dopplers Last WNL IUGR at < 10%--f/u growth on 10/4  2. Supervision of high risk pregnancy, antepartum - Flu Vaccine QUAD 68mo+IM  (Fluarix, Fluzone & Alfiuria Quad PF) - AMBULATORY REFERRAL TO [facility]  3. Diet controlled gestational diabetes mellitus (GDM), antepartum    CBGs are mostly well controlled Continue diet  4. Language barrier Spanish interpreter: Eda used   Preterm labor symptoms and general obstetric precautions including but not limited to vaginal bleeding, contractions, leaking of fluid and fetal movement were reviewed in detail with the patient. Please refer to After Visit Summary for other counseling recommendations.   Return in 2 weeks (on 07/27/2021).  Future Appointments  Date Time Provider Department Center  07/19/2021  2:30 PM [facility] NURSE [facility]  07/19/2021  2:45 PM [facility] US5 [facility]  07/19/2021  3:15 PM [facility] NST [facility]  07/26/2021  2:15 PM [facility] NST [facility]  07/26/2021  3:15 PM [facility] NURSE [facility]  07/26/2021  3:30 PM [facility] US3 [facility]  08/02/2021  2:15 PM [facility] NURSE [facility]  08/02/2021  2:30 PM [facility] US3 [facility]  08/02/2021  3:15 PM [facility] NST [facility]    Reva Bores, MD

## 2021-07-13 NOTE — Patient Instructions (Signed)
Tercer trimestre de embarazo Third Trimester of Pregnancy El tercer trimestre de embarazo va desde la semana 28 hasta la semana 40. Esto corresponde a los meses 7 a 9. El tercer trimestre es un perodo en el que el beb en gestacin (feto) crece rpidamente. Hacia el final del noveno mes, el feto mide alrededor de 20pulgadas (45cm) de largo y pesa entre 6 y 10 libras (2.7 y 4.5kg). Cambios en el cuerpo durante el tercer trimestre Durante el tercer trimestre, su cuerpo contina experimentando numerosos cambios. Los cambios varan y generalmente vuelven a la normalidad despus del nacimiento del beb. Cambios fsicos Seguir aumentando de peso. Es de esperar que aumente entre 25 y 35libras (11 y 16kg) hacia el final del embarazo si inicia el embarazo con un peso normal. Si tiene bajo peso, es de esperar que aumente entre 28 y 40 libras (13 y18 kg), y si tiene sobrepeso, es de esperar que aumente entre 15 y 25 libras (7 y 11kg). Podrn aparecer las primeras estras en las caderas, el abdomen y las mamas. Las mamas seguirn creciendo y pueden doler. Un lquido amarillo (calostro) puede salir de sus pechos. Esta es la primera leche que usted produce para su beb. Tal vez haya cambios en el cabello. Esto cambios pueden incluir su engrosamiento, crecimiento rpido y cambios en la textura. A algunas personas tambin se les cae el cabello durante o despus del embarazo, o tienen el cabello seco o fino. El ombligo puede salir hacia afuera. Puede observar que se le hinchan las manos, el rostro o los tobillos. Cambios en la salud Es posible que tenga acidez estomacal. Puede sufrir estreimiento. Puede desarrollar hemorroides. Puede desarrollar venas hinchadas y abultadas (venas varicosas) en las piernas. Puede presentar ms dolor en la pelvis, la espalda o los muslos. Esto se debe al aumento de peso y al aumento de las hormonas que relajan las articulaciones. Puede presentar un aumento del hormigueo o  entumecimiento en las manos, brazos y piernas. La piel de su abdomen tambin puede sentirse entumecida. Puede sentir que le falta el aire debido a que se expande el tero. Otros cambios Puede tener necesidad de orinar con ms frecuencia porque el feto baja hacia la pelvis y ejerce presin sobre la vejiga. Puede tener ms problemas para dormir. Esto puede deberse al tamao de su abdomen, una mayor necesidad de orinar y un aumento en el metabolismo de su cuerpo. Puede notar que el feto "baja" o lo siente ms bajo, en el abdomen (aligeramiento). Puede tener un aumento de la secrecin vaginal. Puede notar que tiene dolor alrededor del hueso plvico a medida que el tero se distiende. Siga estas instrucciones en su casa: Medicamentos Siga las instrucciones del mdico en relacin con el uso de medicamentos. Durante el embarazo, hay medicamentos que pueden tomarse y otros que no. No tome ningn medicamento a menos que lo haya autorizado el mdico. Tome vitaminas prenatales que contengan por lo menos 600microgramos (mcg) de cido flico. Comida y bebida Lleve una dieta saludable que incluya frutas y verduras frescas, cereales integrales, buenas fuentes de protenas como carnes magras, huevos o tofu, y productos lcteos descremados. Evite la carne cruda y el jugo, la leche y el queso sin pasteurizar. Estos portan grmenes que pueden provocar dao tanto a usted como al beb. Tome 4 o 5 comidas pequeas en lugar de 3 comidas abundantes al da. Es posible que tenga que tomar estas medidas para prevenir o tratar el estreimiento: Beber suficiente lquido como para mantener la orina   de color amarillo plido. Consumir alimentos ricos en fibra, como frijoles, cereales integrales, y frutas y verduras frescas. Limitar el consumo de alimentos ricos en grasa y azcares procesados, como los alimentos fritos o dulces. Actividad Haga ejercicio solamente como se lo haya indicado el mdico. La mayora de las personas  pueden continuar su actividad fsica habitual durante el embarazo. Intente realizar como mnimo 30minutos de actividad fsica por lo menos 5das a la semana. Deje de hacer ejercicio si experimenta contracciones en el tero. Deje de hacer ejercicio si le aparecen dolor o clicos en la parte baja del vientre o de la espalda. Evite levantar pesos excesivos. No haga ejercicio si hace mucho calor o humedad, o si se encuentra a una altitud elevada. Si lo desea, puede seguir teniendo relaciones sexuales, salvo que el mdico le indique lo contrario. Alivio del dolor y del malestar Haga pausas frecuentes y descanse con las piernas levantadas (elevadas) si tiene calambres en las piernas o dolor en la parte baja de la espalda. Dese baos de asiento con agua tibia para aliviar el dolor o las molestias causadas por las hemorroides. Use una crema para las hemorroides si el mdico la autoriza. Use un sujetador que le brinde buen soporte para prevenir las molestias causadas por la sensibilidad en las mamas. Si tiene venas varicosas: Use medias de compresin como se lo haya indicado el mdico. Eleve los pies durante 15minutos, 3 o 4veces por da. Limite el consumo de sal en su dieta. Seguridad Hable con su mdico antes de viajar distancias largas. No se d baos de inmersin en agua caliente, baos turcos ni saunas. Use el cinturn de seguridad en todo momento mientras conduce o va en auto. Hable con el mdico si es vctima de maltrato verbal o fsico. Preparacin para el nacimiento Para prepararse para la llegada de su beb: Tome clases prenatales para entender, practicar, y hacer preguntas sobre el trabajo de parto y el parto. Visite el hospital y recorra el rea de maternidad. Compre un asiento de seguridad orientado hacia atrs, y asegrese de saber cmo instalarlo en su automvil. Prepare la habitacin o el lugar donde dormir el beb. Asegrese de quitar todas las almohadas y animales de peluche de  la cuna del beb para evitar la asfixia. Indicaciones generales Evite el contacto con las bandejas sanitarias de los gatos y la tierra que estos animales usan. Estos alimentos contienen bacterias que pueden causar defectos congnitos en el beb. Si tiene un gato, pdale a alguien que limpie la caja de arena por usted. No se haga lavados vaginales ni use tampones. No use toallas higinicas perfumadas. No consuma ningn producto que contenga nicotina o tabaco, como cigarrillos, cigarrillos electrnicos y tabaco de mascar. Si necesita ayuda para dejar de consumir estos productos, consulte al mdico. No use ningn remedio a base de hierbas, drogas ilegales o medicamentos que no le hayan sido recetados. Las sustancias qumicas de estos productos pueden daar al beb. No beba alcohol. Le realizarn exmenes prenatales ms frecuentes durante el tercer trimestre. Durante una visita prenatal de rutina, el mdico le har un examen fsico, le realizar pruebas y hablar con usted de su salud general. Cumpla con todas las visitas de seguimiento. Esto es importante. Dnde buscar ms informacin American Pregnancy Association (Asociacin Estadounidense del Embarazo): americanpregnancy.org American College of Obstetricians and Gynecologists (Colegio Estadounidense de Obstetras y Gineclogos): acog.org/womens-health/pregnancy? Office on Women's Health (Oficina para la Salud de la Mujer): womenshealth.gov/pregnancy Comunquese con un mdico si tiene: Fiebre. Clicos leves en la   pelvis, presin en la pelvis o dolor persistente en la zona abdominal o la parte baja de la espalda. Vmitos o diarrea. Secrecin vaginal con mal olor u orina con mal olor. Dolor al orinar. Un dolor de cabeza que no desaparece despus de tomar analgsicos. Cambios en la visin o ve manchas delante de los ojos. Solicite ayuda de inmediato si: Rompe la bolsa. Tiene contracciones regulares separadas por menos de 5minutos. Tiene sangrado o  pequeas prdidas vaginales. Siente un dolor abdominal intenso. Tiene dificultad para respirar. Siente dolor en el pecho. Sufre episodios de desmayo. No ha sentido a su beb moverse durante el perodo de tiempo que le indic el mdico. Tiene dolor, hinchazn o enrojecimiento nuevos en un brazo o una pierna o se produce un aumento de alguno de estos sntomas. Resumen El tercer trimestre del embarazo comprende desde la semana28 hasta la semana 40 (desde el mes7 hasta el mes9). Puede tener ms problemas para dormir. Esto puede deberse al tamao de su abdomen, una mayor necesidad de orinar y un aumento en el metabolismo de su cuerpo. Le realizarn exmenes prenatales ms frecuentes durante el tercer trimestre. Cumpla con todas las visitas de seguimiento. Esto es importante. Esta informacin no tiene como fin reemplazar el consejo del mdico. Asegrese de hacerle al mdico cualquier pregunta que tenga. Document Revised: 04/09/2020 Document Reviewed: 04/09/2020 Elsevier Patient Education  2022 Elsevier Inc.  

## 2021-07-19 ENCOUNTER — Ambulatory Visit: Payer: Medicaid Other | Admitting: *Deleted

## 2021-07-19 ENCOUNTER — Encounter: Payer: Self-pay | Admitting: *Deleted

## 2021-07-19 ENCOUNTER — Ambulatory Visit: Payer: Medicaid Other | Attending: Maternal & Fetal Medicine

## 2021-07-19 ENCOUNTER — Other Ambulatory Visit: Payer: Self-pay

## 2021-07-19 VITALS — BP 110/67 | HR 70

## 2021-07-19 DIAGNOSIS — Z3A34 34 weeks gestation of pregnancy: Secondary | ICD-10-CM

## 2021-07-19 DIAGNOSIS — O24410 Gestational diabetes mellitus in pregnancy, diet controlled: Secondary | ICD-10-CM

## 2021-07-19 DIAGNOSIS — O365930 Maternal care for other known or suspected poor fetal growth, third trimester, not applicable or unspecified: Secondary | ICD-10-CM | POA: Diagnosis present

## 2021-07-19 DIAGNOSIS — O0990 Supervision of high risk pregnancy, unspecified, unspecified trimester: Secondary | ICD-10-CM | POA: Diagnosis present

## 2021-07-19 NOTE — Procedures (Signed)
Sheri Perez 03-01-1991 [redacted]w[redacted]d  Fetus A Non-Stress Test Interpretation for 07/19/21  Indication: IUGR  Fetal Heart Rate A Mode: External Baseline Rate (A): 140 bpm Variability: Moderate Accelerations: 15 x 15 Decelerations: None Multiple birth?: No  Uterine Activity Mode: Palpation, Toco Contraction Frequency (min): None Resting Tone Palpated: Relaxed Resting Time: Adequate  Interpretation (Fetal Testing) Nonstress Test Interpretation: Reactive Comments: Dr. Parke Poisson reviewed tracing.

## 2021-07-20 ENCOUNTER — Other Ambulatory Visit: Payer: Self-pay | Admitting: Maternal & Fetal Medicine

## 2021-07-20 DIAGNOSIS — O365930 Maternal care for other known or suspected poor fetal growth, third trimester, not applicable or unspecified: Secondary | ICD-10-CM

## 2021-07-26 ENCOUNTER — Ambulatory Visit: Payer: Medicaid Other | Admitting: *Deleted

## 2021-07-26 ENCOUNTER — Encounter: Payer: Self-pay | Admitting: *Deleted

## 2021-07-26 ENCOUNTER — Ambulatory Visit (HOSPITAL_BASED_OUTPATIENT_CLINIC_OR_DEPARTMENT_OTHER): Payer: Medicaid Other | Admitting: *Deleted

## 2021-07-26 ENCOUNTER — Other Ambulatory Visit: Payer: Self-pay

## 2021-07-26 ENCOUNTER — Ambulatory Visit: Payer: Medicaid Other | Attending: Obstetrics and Gynecology

## 2021-07-26 VITALS — BP 118/68 | HR 71

## 2021-07-26 DIAGNOSIS — O0990 Supervision of high risk pregnancy, unspecified, unspecified trimester: Secondary | ICD-10-CM | POA: Diagnosis present

## 2021-07-26 DIAGNOSIS — O365930 Maternal care for other known or suspected poor fetal growth, third trimester, not applicable or unspecified: Secondary | ICD-10-CM | POA: Insufficient documentation

## 2021-07-26 DIAGNOSIS — Z3A35 35 weeks gestation of pregnancy: Secondary | ICD-10-CM

## 2021-07-26 DIAGNOSIS — O24410 Gestational diabetes mellitus in pregnancy, diet controlled: Secondary | ICD-10-CM | POA: Diagnosis not present

## 2021-07-26 DIAGNOSIS — O365990 Maternal care for other known or suspected poor fetal growth, unspecified trimester, not applicable or unspecified: Secondary | ICD-10-CM | POA: Diagnosis not present

## 2021-07-26 NOTE — Procedures (Signed)
Haley Fuerstenberg Jan 25, 1991 [redacted]w[redacted]d  Fetus A Non-Stress Test Interpretation for 07/26/21  Indication: IUGR  Fetal Heart Rate A Mode: External Baseline Rate (A): 150 bpm Variability: Moderate Accelerations: 15 x 15 Decelerations: None Multiple birth?: No  Uterine Activity Mode: Palpation, Toco Contraction Frequency (min): none Resting Tone Palpated: Relaxed  Interpretation (Fetal Testing) Nonstress Test Interpretation: Reactive Overall Impression: Reassuring for gestational age Comments: Dr. Judeth Cornfield reviewed tracing

## 2021-07-27 ENCOUNTER — Encounter: Payer: Self-pay | Admitting: Family Medicine

## 2021-07-27 ENCOUNTER — Ambulatory Visit (INDEPENDENT_AMBULATORY_CARE_PROVIDER_SITE_OTHER): Payer: Medicaid Other | Admitting: Family Medicine

## 2021-07-27 VITALS — BP 124/81 | HR 86 | Wt 168.0 lb

## 2021-07-27 DIAGNOSIS — K2100 Gastro-esophageal reflux disease with esophagitis, without bleeding: Secondary | ICD-10-CM

## 2021-07-27 DIAGNOSIS — Z789 Other specified health status: Secondary | ICD-10-CM

## 2021-07-27 DIAGNOSIS — O365930 Maternal care for other known or suspected poor fetal growth, third trimester, not applicable or unspecified: Secondary | ICD-10-CM

## 2021-07-27 DIAGNOSIS — O0990 Supervision of high risk pregnancy, unspecified, unspecified trimester: Secondary | ICD-10-CM

## 2021-07-27 DIAGNOSIS — O24410 Gestational diabetes mellitus in pregnancy, diet controlled: Secondary | ICD-10-CM

## 2021-07-27 MED ORDER — OMEPRAZOLE MAGNESIUM 20 MG PO TBEC: 20.0000 mg | DELAYED_RELEASE_TABLET | Freq: Every day | ORAL | 5 refills | Status: DC

## 2021-07-27 MED ORDER — PRENATAL VITAMIN 27-0.8 MG PO TABS
1.0000 | ORAL_TABLET | Freq: Every day | ORAL | 1 refills | Status: AC
Start: 2021-07-27 — End: ?

## 2021-07-27 NOTE — Patient Instructions (Signed)

## 2021-07-27 NOTE — Progress Notes (Signed)
   Subjective:  Sheri Perez is a 30 y.o. G3P2002 at [redacted]w[redacted]d being seen today for ongoing prenatal care.  She is currently monitored for the following issues for this high-risk pregnancy and has Family history of diabetes mellitus in father; Language barrier; Back pain due to injury; Globus sensation; Dyspepsia; Supervision of high risk pregnancy, antepartum; Back pain; IUGR (intrauterine growth restriction) affecting care of mother; and Gestational diabetes mellitus (GDM), antepartum on their problem list.  Patient reports no complaints.  Contractions: Irritability. Vag. Bleeding: None.  Movement: Present. Denies leaking of fluid.   The following portions of the patient's history were reviewed and updated as appropriate: allergies, current medications, past family history, past medical history, past social history, past surgical history and problem list. Problem list updated.  Objective:   Vitals:   07/27/21 1619  BP: 124/81  Pulse: 86  Weight: 168 lb (76.2 kg)    Fetal Status: Fetal Heart Rate (bpm): 137   Movement: Present     General:  Alert, oriented and cooperative. Patient is in no acute distress.  Skin: Skin is warm and dry. No rash noted.   Cardiovascular: Normal heart rate noted  Respiratory: Normal respiratory effort, no problems with respiration noted  Abdomen: Soft, gravid, appropriate for gestational age. Pain/Pressure: Absent     Pelvic: Vag. Bleeding: None     Cervical exam deferred        Extremities: Normal range of motion.  Edema: None  Mental Status: Normal mood and affect. Normal behavior. Normal judgment and thought content.     Urinalysis:      Assessment and Plan:  Pregnancy: G3P2002 at [redacted]w[redacted]d  1. Supervision of high risk pregnancy, antepartum BP and FHR normal Swabs at next visit  2. Diet controlled gestational diabetes mellitus (GDM), antepartum Overall good control, continue dietary measures  3. Poor fetal growth affecting management of  mother in third trimester, single or unspecified fetus Last growth 10/4 with EFW 1796g at 2%, HC<1%, AC 10%, FL<1%, HUM<5% BPP 10/10 yesterday MFM recommend IOL around 37 weeks After discussion scheduled for 08/11/21 Orders placed  4. Language barrier Spanish  Preterm labor symptoms and general obstetric precautions including but not limited to vaginal bleeding, contractions, leaking of fluid and fetal movement were reviewed in detail with the patient. Please refer to After Visit Summary for other counseling recommendations.  Return in 1 week (on 08/03/2021) for [facility], ob visit, needs MD.   Venora Maples, MD

## 2021-08-02 ENCOUNTER — Ambulatory Visit: Payer: Medicaid Other | Admitting: *Deleted

## 2021-08-02 ENCOUNTER — Ambulatory Visit (HOSPITAL_BASED_OUTPATIENT_CLINIC_OR_DEPARTMENT_OTHER): Payer: Medicaid Other | Admitting: *Deleted

## 2021-08-02 ENCOUNTER — Encounter: Payer: Self-pay | Admitting: *Deleted

## 2021-08-02 ENCOUNTER — Other Ambulatory Visit: Payer: Self-pay | Admitting: Advanced Practice Midwife

## 2021-08-02 ENCOUNTER — Other Ambulatory Visit: Payer: Self-pay

## 2021-08-02 ENCOUNTER — Ambulatory Visit: Payer: Medicaid Other | Attending: Obstetrics and Gynecology

## 2021-08-02 VITALS — BP 122/73 | HR 77

## 2021-08-02 DIAGNOSIS — O365990 Maternal care for other known or suspected poor fetal growth, unspecified trimester, not applicable or unspecified: Secondary | ICD-10-CM | POA: Insufficient documentation

## 2021-08-02 DIAGNOSIS — O365930 Maternal care for other known or suspected poor fetal growth, third trimester, not applicable or unspecified: Secondary | ICD-10-CM | POA: Insufficient documentation

## 2021-08-02 DIAGNOSIS — Z3A36 36 weeks gestation of pregnancy: Secondary | ICD-10-CM | POA: Diagnosis not present

## 2021-08-02 DIAGNOSIS — O24410 Gestational diabetes mellitus in pregnancy, diet controlled: Secondary | ICD-10-CM | POA: Diagnosis not present

## 2021-08-02 DIAGNOSIS — O0990 Supervision of high risk pregnancy, unspecified, unspecified trimester: Secondary | ICD-10-CM

## 2021-08-02 NOTE — Procedures (Signed)
Sheri Perez 08-07-1991 [redacted]w[redacted]d  Fetus A Non-Stress Test Interpretation for 08/02/21  Indication: IUGR  Fetal Heart Rate A Mode: External Baseline Rate (A): 155 bpm Variability: Moderate Accelerations: 15 x 15 Decelerations: None Multiple birth?: No  Uterine Activity Mode: Palpation, Toco Contraction Frequency (min): UI Contraction Quality: Mild Resting Tone Palpated: Relaxed Resting Time: Adequate  Interpretation (Fetal Testing) Nonstress Test Interpretation: Reactive Comments: Dr. Grace Bushy reviewed tracing.

## 2021-08-03 ENCOUNTER — Other Ambulatory Visit (HOSPITAL_COMMUNITY)
Admission: RE | Admit: 2021-08-03 | Discharge: 2021-08-03 | Disposition: A | Discharge status: Home / Self Care | Payer: Medicaid Other | Source: Ambulatory Visit | Attending: Family Medicine | Admitting: Family Medicine

## 2021-08-03 ENCOUNTER — Ambulatory Visit (INDEPENDENT_AMBULATORY_CARE_PROVIDER_SITE_OTHER): Payer: Medicaid Other | Admitting: Family Medicine

## 2021-08-03 ENCOUNTER — Encounter: Payer: Self-pay | Admitting: Family Medicine

## 2021-08-03 VITALS — BP 114/72 | HR 86 | Wt 168.0 lb

## 2021-08-03 DIAGNOSIS — O365930 Maternal care for other known or suspected poor fetal growth, third trimester, not applicable or unspecified: Secondary | ICD-10-CM

## 2021-08-03 DIAGNOSIS — O24410 Gestational diabetes mellitus in pregnancy, diet controlled: Secondary | ICD-10-CM | POA: Diagnosis not present

## 2021-08-03 DIAGNOSIS — O0990 Supervision of high risk pregnancy, unspecified, unspecified trimester: Secondary | ICD-10-CM | POA: Insufficient documentation

## 2021-08-03 DIAGNOSIS — Z789 Other specified health status: Secondary | ICD-10-CM

## 2021-08-03 LAB — OB RESULTS CONSOLE GC/CHLAMYDIA: Gonorrhea: NEGATIVE

## 2021-08-03 NOTE — Patient Instructions (Signed)
Eleccin del mtodo anticonceptivo Contraception Choices La anticoncepcin, o los mtodos anticonceptivos, hace referencia a los mtodos o dispositivos que evitan el embarazo. Mtodos hormonales Implante anticonceptivo Un implante anticonceptivo consiste en un tubo delgado de plstico que contiene una hormona que evita el embarazo. Es diferente de un dispositivo intrauterino (DIU). Un mdico lo inserta en la parte superior del brazo. Los implantes pueden ser eficaces durante un mximo de 3 aos. Inyecciones de progestina sola Las inyecciones de progestina sola contienen progestina, una forma sinttica de la hormona progesterona. Un mdico las administra cada 3 meses. Pldoras anticonceptivas Las pldoras anticonceptivas son pastillas que contienen hormonas que evitan el embarazo. Deben tomarse una vez al da, preferentemente a la misma hora cada da. Se necesita una receta para utilizar este mtodo anticonceptivo. Parche anticonceptivo El parche anticonceptivo contiene hormonas que evitan el embarazo. Se coloca en la piel, debe cambiarse una vez a la semana durante tres semanas y debe retirarse en la cuarta semana. Se necesita una receta para utilizar este mtodo anticonceptivo. Anillo vaginal Un anillo vaginal contiene hormonas que evitan el embarazo. Se coloca en la vagina durante tres semanas y se retira en la cuarta semana. Luego se repite el proceso con un anillo nuevo. Se necesita una receta para utilizar este mtodo anticonceptivo. Anticonceptivo de emergencia Los anticonceptivos de emergencia son mtodos para evitar un embarazo despus de tener sexo sin proteccin. Vienen en forma de pldora y pueden tomarse hasta 5 das despus de tener sexo. Funcionan mejor cuando se toman lo ms pronto posible luego de tener sexo. La mayora de los anticonceptivos de emergencia estn disponibles sin receta mdica. Este mtodo no debe utilizarse como el nico mtodo anticonceptivo. Mtodos de  barrera Condn masculino Un condn masculino es una vaina delgada que se coloca sobre el pene durante el sexo. Los condones evitan que el esperma ingrese en el cuerpo de la mujer. Pueden utilizarse con un una sustancia que mata a los espermatozoides (espermicida) para aumentar la efectividad. Deben desecharse despus de un uso. Condn femenino Un condn femenino es una vaina blanda y holgada que se coloca en la vagina antes de tener sexo. El condn evita que el esperma ingrese en el cuerpo de la mujer. Deben desecharse despus de un uso. Diafragma Un diafragma es una barrera blanda con forma de cpula. Se inserta en la vagina antes del sexo, junto con un espermicida. El diafragma bloquea el ingreso de esperma en el tero, y el espermicida mata a los espermatozoides. El diafragma debe permanecer en la vagina durante 6 a 8 horas despus de tener sexo y debe retirarse en el plazo de las 24 horas. Un diafragma es recetado y colocado por un mdico. Debe reemplazarse cada 1 a 2 aos, despus de dar a luz, de aumentar ms de 15lb (6.8kg) y de una ciruga plvica. Capuchn cervical Un capuchn cervical es una copa redonda y blanda de ltex o plstico que se coloca en el cuello uterino. Se inserta en la vagina antes del sexo, junto con un espermicida. Bloquea el ingreso del esperma en el tero. El capuchn debe permanecer en el lugar durante 6 a 8 horas despus de tener sexo y debe retirarse en el plazo de las 48 horas. Un capuchn cervical debe ser recetado y colocado por un mdico. Debe reemplazarse cada 2aos. Esponja Una esponja es una pieza blanda y circular de espuma de poliuretano que contiene espermicida. La esponja ayuda a bloquear el ingreso de esperma en el tero, y el espermicida mata a   los espermatozoides. Para utilizarla, debe humedecerla e insertarla en la vagina. Debe insertarse antes de tener sexo, debe permanecer dentro al menos durante 6 horas despus de tener sexo y debe retirarse y  desecharse en el plazo de las 30 horas. Espermicidas Los espermicidas son sustancias qumicas que matan o bloquean al esperma y no lo dejan ingresar al cuello uterino y al tero. Vienen en forma de crema, gel, supositorio, espuma o comprimido. Un espermicida debe insertarse en la vagina con un aplicador al menos 10 o 15 minutos antes de tener sexo para dar tiempo a que surta efecto. El proceso debe repetirse cada vez que tenga sexo. Los espermicidas no requieren receta mdica. Anticonceptivos intrauterinos Dispositivo intrauterino (DIU) Un DIU es un dispositivo en forma de T que se coloca en el tero. Existen dos tipos: DIU hormonal.Este tipo contiene progestina, una forma sinttica de la hormona progesterona. Este tipo puede permanecer colocado durante 3 a 5 aos. DIU de cobre.Este tipo est recubierto con un alambre de cobre. Puede permanecer colocado durante 10 aos. Mtodos anticonceptivos permanentes Ligadura de trompas en la mujer En este mtodo, se sellan, atan u obstruyen las trompas de Falopio durante una ciruga para evitar que el vulo descienda hacia el tero. Esterilizacin histeroscpica En este mtodo, se coloca un implante pequeo y flexible dentro de cada trompa de Falopio. Los implantes hacen que se forme un tejido cicatricial en las trompas de Falopio y que las obstruya para que el espermatozoide no pueda llegar al vulo. El procedimiento demora alrededor de 3 meses para que sea efectivo. Debe utilizarse otro mtodo anticonceptivo durante esos 3 meses. Esterilizacin masculina Este es un procedimiento que consiste en atar los conductos que transportan el esperma (vasectoma). Luego del procedimiento, el hombre puede eyacular lquido (semen). Debe utilizarse otro mtodo anticonceptivo durante 3 meses despus del procedimiento. Mtodos de planificacin natural Planificacin familiar natural En este mtodo, la pareja no tiene sexo durante los das en que la mujer podra quedar  embarazada. Mtodo calendario En este mtodo, la mujer realiza un seguimiento de la duracin de cada ciclo menstrual, identifica los das en los que se puede producir un embarazo y no tiene sexo durante esos das. Mtodo de la ovulacin En este mtodo, la pareja evita tener sexo durante la ovulacin. Mtodo sintotrmico Este mtodo implica no tener sexo durante la ovulacin. Normalmente, la mujer comprueba la ovulacin al observar cambios en su temperatura y en la consistencia del moco cervical. Mtodo posovulacin En este mtodo, la pareja espera a que finalice la ovulacin para tener sexo. Dnde buscar ms informacin Centers for Disease Control and Prevention (Centros para el Control y la Prevencin de Enfermedades): www.cdc.gov Resumen La anticoncepcin, o los mtodos anticonceptivos, hace referencia a los mtodos o dispositivos que evitan el embarazo. Los mtodos anticonceptivos hormonales incluyen implantes, inyecciones, pastillas, parches, anillos vaginales y anticonceptivos de emergencia. Los mtodos anticonceptivos de barrera pueden incluir condones masculinos, condones femeninos, diafragmas, capuchones cervicales, esponjas y espermicidas. Existen dos tipos de DIU (dispositivo intrauterino). Un DIU puede colocarse en el tero de una mujer para evitar el embarazo durante 3 a 5 aos. La esterilizacin permanente puede realizarse mediante un procedimiento tanto en los hombres como en las mujeres. Los mtodos de planificacin familiar natural implican no tener sexo durante los das en que la mujer podra quedar embarazada. Esta informacin no tiene como fin reemplazar el consejo del mdico. Asegrese de hacerle al mdico cualquier pregunta que tenga. Document Revised: 05/04/2020 Document Reviewed: 05/04/2020 Elsevier Patient Education  2022 Elsevier   Inc.  

## 2021-08-03 NOTE — Progress Notes (Signed)
   Subjective:  Sheri Perez is a 30 y.o. G3P2002 at [redacted]w[redacted]d being seen today for ongoing prenatal care.  She is currently monitored for the following issues for this high-risk pregnancy and has Family history of diabetes mellitus in father; Language barrier; Back pain due to injury; Globus sensation; Dyspepsia; Supervision of high risk pregnancy, antepartum; Back pain; IUGR (intrauterine growth restriction) affecting care of mother; and Gestational diabetes mellitus (GDM), antepartum on their problem list.  Patient reports no complaints.  Contractions: Irritability. Vag. Bleeding: None.  Movement: Present. Denies leaking of fluid.   The following portions of the patient's history were reviewed and updated as appropriate: allergies, current medications, past family history, past medical history, past social history, past surgical history and problem list. Problem list updated.  Objective:   Vitals:   08/03/21 1426  BP: 114/72  Pulse: 86  Weight: 168 lb (76.2 kg)    Fetal Status: Fetal Heart Rate (bpm): 143   Movement: Present     General:  Alert, oriented and cooperative. Patient is in no acute distress.  Skin: Skin is warm and dry. No rash noted.   Cardiovascular: Normal heart rate noted  Respiratory: Normal respiratory effort, no problems with respiration noted  Abdomen: Soft, gravid, appropriate for gestational age. Pain/Pressure: Present     Pelvic: Vag. Bleeding: None Vag D/C Character: White   Cervical exam deferred        Extremities: Normal range of motion.  Edema: None  Mental Status: Normal mood and affect. Normal behavior. Normal judgment and thought content.    Urinalysis:      Assessment and Plan:  Pregnancy: G3P2002 at [redacted]w[redacted]d  1. Supervision of high risk pregnancy, antepartum BP and FHR normal  2. Diet controlled gestational diabetes mellitus (GDM), antepartum Sugars well controlled BPP 10/10 two days prior Remains IUGR on most recent scan  3. Poor  fetal growth affecting management of mother in third trimester, single or unspecified fetus 2.2% last scan Two days prior had BPP and cord dopplers, BPP 10/10 however cord dopplers were elevated for the first time without AEDF or REDF Patient had previously been scheduled for IOL at [redacted]w[redacted]d, however given elevated cord dopplers will need to be moved up to [redacted]w[redacted]d In addition patient is now unfortunately breech Counseled on version, 1% risk of need for emergency cesarean, as well as need for cesarean in case of failure. Otherwise if successful can do IOL Patient in agreement with plan, ECV posted for 08/08/21 Stressed importance of being NPO after midnight  4. Language barrier Spanish  Preterm labor symptoms and general obstetric precautions including but not limited to vaginal bleeding, contractions, leaking of fluid and fetal movement were reviewed in detail with the patient. Please refer to After Visit Summary for other counseling recommendations.  Return in 7 weeks (on 09/21/2021) for PP check.   Venora Maples, MD

## 2021-08-04 ENCOUNTER — Telehealth (HOSPITAL_COMMUNITY): Payer: Self-pay | Admitting: *Deleted

## 2021-08-04 ENCOUNTER — Encounter (HOSPITAL_COMMUNITY): Payer: Self-pay | Admitting: *Deleted

## 2021-08-04 LAB — GC/CHLAMYDIA PROBE AMP (~~LOC~~) NOT AT ARMC
Chlamydia: NEGATIVE
Comment: NEGATIVE
Comment: NORMAL
Neisseria Gonorrhea: NEGATIVE

## 2021-08-04 SURGERY — Surgical Case
Anesthesia: *Unknown

## 2021-08-04 NOTE — Telephone Encounter (Signed)
Interpreter number [phone]

## 2021-08-05 ENCOUNTER — Telehealth: Payer: Self-pay | Admitting: General Practice

## 2021-08-05 NOTE — Telephone Encounter (Signed)
Patient called into front office wanting to know what she can use for hemorrhoids prior to delivery. Reviewed options with patient with Raquel for spanish interpreter. Patient verbalized understanding.

## 2021-08-06 LAB — CULTURE, BETA STREP (GROUP B ONLY): Strep Gp B Culture: NEGATIVE

## 2021-08-08 ENCOUNTER — Inpatient Hospital Stay (HOSPITAL_COMMUNITY): Payer: Medicaid Other | Admitting: Anesthesiology

## 2021-08-08 ENCOUNTER — Other Ambulatory Visit: Payer: Self-pay

## 2021-08-08 ENCOUNTER — Encounter (HOSPITAL_COMMUNITY)
Admission: AD | Disposition: A | Discharge status: Home / Self Care | Payer: Self-pay | Source: Home / Self Care | Attending: Obstetrics and Gynecology

## 2021-08-08 ENCOUNTER — Encounter (HOSPITAL_COMMUNITY): Payer: Medicaid Other

## 2021-08-08 ENCOUNTER — Encounter (HOSPITAL_COMMUNITY): Payer: Self-pay | Admitting: Family Medicine

## 2021-08-08 ENCOUNTER — Observation Stay (HOSPITAL_COMMUNITY): Payer: Medicaid Other

## 2021-08-08 ENCOUNTER — Encounter (HOSPITAL_COMMUNITY): Payer: Self-pay

## 2021-08-08 ENCOUNTER — Inpatient Hospital Stay (HOSPITAL_COMMUNITY): Admission: AD | Admit: 2021-08-08 | Payer: Medicaid Other | Source: Home / Self Care | Admitting: Family Medicine

## 2021-08-08 ENCOUNTER — Inpatient Hospital Stay (HOSPITAL_COMMUNITY)
Admission: AD | Admit: 2021-08-08 | Discharge: 2021-08-11 | DRG: 807 | Disposition: A | Discharge status: Home / Self Care | Payer: Medicaid Other | Attending: Obstetrics and Gynecology | Admitting: Obstetrics and Gynecology

## 2021-08-08 DIAGNOSIS — O9962 Diseases of the digestive system complicating childbirth: Secondary | ICD-10-CM | POA: Diagnosis present

## 2021-08-08 DIAGNOSIS — Z3A37 37 weeks gestation of pregnancy: Secondary | ICD-10-CM

## 2021-08-08 DIAGNOSIS — K219 Gastro-esophageal reflux disease without esophagitis: Secondary | ICD-10-CM | POA: Diagnosis present

## 2021-08-08 DIAGNOSIS — O99214 Obesity complicating childbirth: Secondary | ICD-10-CM | POA: Diagnosis present

## 2021-08-08 DIAGNOSIS — O0990 Supervision of high risk pregnancy, unspecified, unspecified trimester: Secondary | ICD-10-CM

## 2021-08-08 DIAGNOSIS — O24420 Gestational diabetes mellitus in childbirth, diet controlled: Secondary | ICD-10-CM | POA: Diagnosis present

## 2021-08-08 DIAGNOSIS — Z789 Other specified health status: Secondary | ICD-10-CM | POA: Diagnosis present

## 2021-08-08 DIAGNOSIS — O26893 Other specified pregnancy related conditions, third trimester: Secondary | ICD-10-CM | POA: Diagnosis present

## 2021-08-08 DIAGNOSIS — O365930 Maternal care for other known or suspected poor fetal growth, third trimester, not applicable or unspecified: Principal | ICD-10-CM | POA: Diagnosis present

## 2021-08-08 DIAGNOSIS — Z20822 Contact with and (suspected) exposure to covid-19: Secondary | ICD-10-CM | POA: Diagnosis present

## 2021-08-08 DIAGNOSIS — O24419 Gestational diabetes mellitus in pregnancy, unspecified control: Secondary | ICD-10-CM | POA: Diagnosis present

## 2021-08-08 DIAGNOSIS — O365990 Maternal care for other known or suspected poor fetal growth, unspecified trimester, not applicable or unspecified: Secondary | ICD-10-CM | POA: Diagnosis present

## 2021-08-08 LAB — CBC
HCT: 37.2 % (ref 36.0–46.0)
Hemoglobin: 12.4 g/dL (ref 12.0–15.0)
MCH: 31.2 pg (ref 26.0–34.0)
MCHC: 33.3 g/dL (ref 30.0–36.0)
MCV: 93.5 fL (ref 80.0–100.0)
Platelets: 139 10*3/uL — ABNORMAL LOW (ref 150–400)
RBC: 3.98 MIL/uL (ref 3.87–5.11)
RDW: 12.5 % (ref 11.5–15.5)
WBC: 8.1 10*3/uL (ref 4.0–10.5)
nRBC: 0 % (ref 0.0–0.2)

## 2021-08-08 LAB — GLUCOSE, CAPILLARY
Glucose-Capillary: 69 mg/dL — ABNORMAL LOW (ref 70–99)
Glucose-Capillary: 76 mg/dL (ref 70–99)
Glucose-Capillary: 70 mg/dL (ref 70–99)

## 2021-08-08 LAB — RESP PANEL BY RT-PCR (FLU A&B, COVID) ARPGX2
Influenza A by PCR: NEGATIVE
Influenza B by PCR: NEGATIVE
SARS Coronavirus 2 by RT PCR: NEGATIVE

## 2021-08-08 LAB — TYPE AND SCREEN
ABO/RH(D): O POS
Antibody Screen: NEGATIVE

## 2021-08-08 LAB — GLUCOSE, RANDOM: Glucose, Bld: 85 mg/dL (ref 70–99)

## 2021-08-08 SURGERY — Surgical Case
Anesthesia: Regional | Laterality: Bilateral

## 2021-08-08 MED ORDER — OXYTOCIN-SODIUM CHLORIDE 30-0.9 UT/500ML-% IV SOLN: 2.5000 [IU]/h | INTRAVENOUS | Status: DC

## 2021-08-08 MED ORDER — LACTATED RINGERS IV SOLN
500.0000 mL | Freq: Once | INTRAVENOUS | Status: AC
  Administered 2021-08-08: 500 mL via INTRAVENOUS

## 2021-08-08 MED ORDER — OXYCODONE-ACETAMINOPHEN 5-325 MG PO TABS: 2.0000 | ORAL_TABLET | ORAL | Status: DC | PRN

## 2021-08-08 MED ORDER — EPHEDRINE 5 MG/ML INJ: 10.0000 mg | INTRAVENOUS | Status: DC | PRN

## 2021-08-08 MED ORDER — ONDANSETRON HCL 4 MG/2ML IJ SOLN: 4.0000 mg | Freq: Four times a day (QID) | INTRAMUSCULAR | Status: DC | PRN

## 2021-08-08 MED ORDER — DIPHENHYDRAMINE HCL 50 MG/ML IJ SOLN: 12.5000 mg | INTRAMUSCULAR | Status: DC | PRN

## 2021-08-08 MED ORDER — OXYTOCIN-SODIUM CHLORIDE 30-0.9 UT/500ML-% IV SOLN
1.0000 m[IU]/min | INTRAVENOUS | Status: DC
  Administered 2021-08-08: 2 m[IU]/min via INTRAVENOUS
  Filled 2021-08-08: qty 500

## 2021-08-08 MED ORDER — TERBUTALINE SULFATE 1 MG/ML IJ SOLN: 0.2500 mg | Freq: Once | INTRAMUSCULAR | Status: DC | PRN

## 2021-08-08 MED ORDER — FENTANYL CITRATE (PF) 100 MCG/2ML IJ SOLN: 50.0000 ug | INTRAMUSCULAR | Status: DC | PRN

## 2021-08-08 MED ORDER — LIDOCAINE HCL (PF) 1 % IJ SOLN
30.0000 mL | INTRAMUSCULAR | Status: DC | PRN
  Filled 2021-08-08: qty 30

## 2021-08-08 MED ORDER — LACTATED RINGERS IV SOLN: INTRAVENOUS | Status: DC

## 2021-08-08 MED ORDER — SOD CITRATE-CITRIC ACID 500-334 MG/5ML PO SOLN: 30.0000 mL | ORAL | Status: DC | PRN

## 2021-08-08 MED ORDER — LIDOCAINE HCL (PF) 1 % IJ SOLN: 30.0000 mL | INTRAMUSCULAR | Status: DC | PRN

## 2021-08-08 MED ORDER — LACTATED RINGERS IV SOLN: 500.0000 mL | INTRAVENOUS | Status: DC | PRN

## 2021-08-08 MED ORDER — PHENYLEPHRINE 40 MCG/ML (10ML) SYRINGE FOR IV PUSH (FOR BLOOD PRESSURE SUPPORT): 80.0000 ug | PREFILLED_SYRINGE | INTRAVENOUS | Status: DC | PRN

## 2021-08-08 MED ORDER — OXYTOCIN BOLUS FROM INFUSION: 333.0000 mL | Freq: Once | INTRAVENOUS | Status: DC

## 2021-08-08 MED ORDER — FENTANYL-BUPIVACAINE-NACL 0.5-0.125-0.9 MG/250ML-% EP SOLN
EPIDURAL | Status: AC
  Filled 2021-08-08: qty 250

## 2021-08-08 MED ORDER — OXYCODONE-ACETAMINOPHEN 5-325 MG PO TABS: 1.0000 | ORAL_TABLET | ORAL | Status: DC | PRN

## 2021-08-08 MED ORDER — FENTANYL CITRATE (PF) 100 MCG/2ML IJ SOLN
100.0000 ug | INTRAMUSCULAR | Status: DC | PRN
  Administered 2021-08-08: 100 ug via INTRAVENOUS
  Filled 2021-08-08: qty 2

## 2021-08-08 MED ORDER — ACETAMINOPHEN 325 MG PO TABS: 650.0000 mg | ORAL_TABLET | ORAL | Status: DC | PRN

## 2021-08-08 MED ORDER — LIDOCAINE HCL (PF) 1 % IJ SOLN
INTRAMUSCULAR | Status: DC | PRN
  Administered 2021-08-08: 10 mL via EPIDURAL

## 2021-08-08 MED ORDER — FENTANYL-BUPIVACAINE-NACL 0.5-0.125-0.9 MG/250ML-% EP SOLN: 12.0000 mL/h | EPIDURAL | Status: DC | PRN

## 2021-08-08 MED ORDER — OXYTOCIN BOLUS FROM INFUSION
333.0000 mL | Freq: Once | INTRAVENOUS | Status: AC
  Administered 2021-08-09: 333 mL via INTRAVENOUS

## 2021-08-08 MED ORDER — ONDANSETRON HCL 4 MG/2ML IJ SOLN
4.0000 mg | Freq: Four times a day (QID) | INTRAMUSCULAR | Status: DC | PRN
  Administered 2021-08-09: 4 mg via INTRAVENOUS
  Filled 2021-08-08: qty 2

## 2021-08-08 MED ORDER — FENTANYL CITRATE (PF) 100 MCG/2ML IJ SOLN
INTRAMUSCULAR | Status: AC
  Administered 2021-08-08: 100 ug via INTRAVENOUS
  Filled 2021-08-08: qty 2

## 2021-08-08 MED ORDER — FLEET ENEMA 7-19 GM/118ML RE ENEM: 1.0000 | ENEMA | RECTAL | Status: DC | PRN

## 2021-08-08 MED ORDER — FENTANYL-BUPIVACAINE-NACL 0.5-0.125-0.9 MG/250ML-% EP SOLN
12.0000 mL/h | EPIDURAL | Status: DC | PRN
  Administered 2021-08-08: 12 mL/h via EPIDURAL

## 2021-08-08 NOTE — Progress Notes (Signed)
LABOR PROGRESS NOTE  Jaliza Ethelene Hal is a 30 y.o. G3P2002 at [redacted]w[redacted]d  presented for IOL for severe IUGR.   Subjective: Comfortable with Epidural.  Objective: BP (!) 103/59   Pulse 68   Temp 98.3 F (36.8 C) (Oral)   Resp 18   Ht 5\' 2"  (1.575 m)   Wt 76.7 kg   LMP 11/16/2020   SpO2 100%   BMI 30.91 kg/m  Vitals:   08/08/21 2131 08/08/21 2201 08/08/21 2231 08/08/21 2302  BP: 130/75 132/81 133/82 (!) 103/59  Pulse: (!) 109 77 82 68  Resp:      Temp:      TempSrc:      SpO2:      Weight:      Height:       Dilation: 5 Effacement (%): 60 Cervical Position: Middle Station: -3 Presentation: Vertex Exam by:: Bria Torrence RN Fetal monitoring: Baseline: 155 bpm, Variability: Good {> 6 bpm), Accelerations: Reactive, and Decelerations: Absent Uterine activity: None, likely not picked up on toco well  Labs: Lab Results  Component Value Date   WBC 8.1 08/08/2021   HGB 12.4 08/08/2021   HCT 37.2 08/08/2021   MCV 93.5 08/08/2021   PLT 139 (L) 08/08/2021    Patient Active Problem List   Diagnosis Date Noted   Gestational diabetes mellitus (GDM), antepartum 06/14/2021   IUGR (intrauterine growth restriction) affecting care of mother 03/29/2021   Supervision of high risk pregnancy, antepartum 01/26/2021   Back pain    Dyspepsia 01/04/2021   Globus sensation 12/08/2020   Back pain due to injury 11/25/2020   Language barrier 10/15/2017   Family history of diabetes mellitus in father 07/23/2017    Assessment / Plan: 31 y.o. G3P2002 at [redacted]w[redacted]d here for IOL for severe IUGR.   Labor: Foley balloon out. Continue pitocin.  Fetal Wellbeing:  Category I Pain Control:  Epidural Anticipated MOD:  Vaginal #GBS negative #A1GDM: Most recent CBG 70. Continue q4h checks  [redacted]w[redacted]d, DO 08/08/2021, 11:40 PM PGY-1, [facility]

## 2021-08-08 NOTE — Anesthesia Preprocedure Evaluation (Signed)
Anesthesia Evaluation  Patient identified by MRN, date of birth, ID band Patient awake    Reviewed: Allergy & Precautions, H&P , NPO status , Patient's Chart, lab work & pertinent test results, reviewed documented beta blocker date and time   Airway Mallampati: III  TM Distance: >3 FB Neck ROM: full    Dental no notable dental hx. (+) Teeth Intact, Dental Advisory Given   Pulmonary neg pulmonary ROS,    Pulmonary exam normal breath sounds clear to auscultation       Cardiovascular negative cardio ROS Normal cardiovascular exam Rhythm:regular Rate:Normal     Neuro/Psych negative neurological ROS  negative psych ROS   GI/Hepatic Neg liver ROS, GERD  ,  Endo/Other  diabetes, GestationalMorbid obesity  Renal/GU negative Renal ROS  negative genitourinary   Musculoskeletal   Abdominal (+) + obese,   Peds  Hematology negative hematology ROS (+)   Anesthesia Other Findings Severe  IUGR  Reproductive/Obstetrics (+) Pregnancy                             Anesthesia Physical Anesthesia Plan  ASA: 3  Anesthesia Plan: Epidural   Post-op Pain Management:    Induction:   PONV Risk Score and Plan: 2 and Treatment may vary due to age or medical condition  Airway Management Planned: Natural Airway  Additional Equipment: None  Intra-op Plan:   Post-operative Plan:   Informed Consent: I have reviewed the patients History and Physical, chart, labs and discussed the procedure including the risks, benefits and alternatives for the proposed anesthesia with the patient or authorized representative who has indicated his/her understanding and acceptance.       Plan Discussed with: Anesthesiologist and CRNA  Anesthesia Plan Comments:         Anesthesia Quick Evaluation
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
## 2021-08-08 NOTE — Anesthesia Procedure Notes (Signed)
Epidural Patient location during procedure: OB Start time: 08/08/2021 8:23 PM End time: 08/08/2021 8:27 PM  Staffing Anesthesiologist: Bethena Midget, MD  Preanesthetic Checklist Completed: patient identified, IV checked, site marked, risks and benefits discussed, surgical consent, monitors and equipment checked, pre-op evaluation and timeout performed  Epidural Patient position: sitting Prep: DuraPrep and site prepped and draped Patient monitoring: continuous pulse ox and blood pressure Approach: midline Location: L4-L5 Injection technique: LOR air  Needle:  Needle type: Tuohy  Needle gauge: 17 G Needle length: 9 cm and 9 Needle insertion depth: 7 cm Catheter type: closed end flexible Catheter size: 19 Gauge Catheter at skin depth: 10 cm Test dose: negative  Assessment Events: blood not aspirated, injection not painful, no injection resistance, no paresthesia and negative IV test

## 2021-08-08 NOTE — Progress Notes (Signed)
Waiting on patient arrival. Called patient with spanish interpreter to check in. Patient reports she was told to wait until she received a phone call to come into the hospital. Patient is now on her way. Will notify MD.   Lenox Ponds, RN

## 2021-08-08 NOTE — Progress Notes (Signed)
Labor Progress Note Dijon Ethelene Hal is a 30 y.o. G3P2002 at [redacted]w[redacted]d presented for IOL for severe IUGR.  S: Patient is resting comfortably. Pitocin at 6. She is open to foley bulb placement.  O:  BP 120/70   Pulse 66   Temp 98.4 F (36.9 C) (Oral)   Resp 20   Ht 5\' 2"  (1.575 m)   Wt 76.7 kg   LMP 11/16/2020   BMI 30.91 kg/m  EFM: 145 bpm/moderate variability/+ accels, occassional variable decel Toco: contractions q2-3 minutes  CVE: Dilation: Closed Effacement (%): Thick Cervical Position: Middle Station: -3 Presentation: Vertex Exam by:: Dr. [email or web address]   A&P: 30 y.o. 38 [redacted]w[redacted]d presenting for IOL for severe IUGR.  #Labor: Progressing well. Cook catheter placed 1642. Pitocin at 6, contracting well. Will continue low dose pitocin until FB dislodged. #Pain: epidural when desired #FWB: cat II, overall reassuring with moderate variability and accels, only occasional variable decel #GBS negative  #Severe IUGR- continue induction as above.   #A1GDM- most recent BG 69. Continue q4hrs while in latent labor.  [redacted]w[redacted]d, MD Center for [facility], [facility] 4:46 PM

## 2021-08-09 ENCOUNTER — Encounter (HOSPITAL_COMMUNITY): Payer: Self-pay | Admitting: Family Medicine

## 2021-08-09 DIAGNOSIS — O365930 Maternal care for other known or suspected poor fetal growth, third trimester, not applicable or unspecified: Secondary | ICD-10-CM

## 2021-08-09 DIAGNOSIS — O24420 Gestational diabetes mellitus in childbirth, diet controlled: Secondary | ICD-10-CM

## 2021-08-09 DIAGNOSIS — Z3A37 37 weeks gestation of pregnancy: Secondary | ICD-10-CM

## 2021-08-09 LAB — GLUCOSE, CAPILLARY
Glucose-Capillary: 79 mg/dL (ref 70–99)
Glucose-Capillary: 81 mg/dL (ref 70–99)

## 2021-08-09 MED ORDER — DIPHENHYDRAMINE HCL 25 MG PO CAPS: 25.0000 mg | ORAL_CAPSULE | Freq: Four times a day (QID) | ORAL | Status: DC | PRN

## 2021-08-09 MED ORDER — COCONUT OIL OIL: 1.0000 "application " | TOPICAL_OIL | Status: DC | PRN

## 2021-08-09 MED ORDER — WITCH HAZEL-GLYCERIN EX PADS: 1.0000 "application " | MEDICATED_PAD | CUTANEOUS | Status: DC | PRN

## 2021-08-09 MED ORDER — OXYCODONE HCL 5 MG PO TABS: 5.0000 mg | ORAL_TABLET | ORAL | Status: DC | PRN

## 2021-08-09 MED ORDER — IBUPROFEN 600 MG PO TABS
600.0000 mg | ORAL_TABLET | Freq: Four times a day (QID) | ORAL | Status: DC
  Administered 2021-08-09 – 2021-08-11 (×10): 600 mg via ORAL
  Filled 2021-08-09 (×9): qty 1

## 2021-08-09 MED ORDER — ZOLPIDEM TARTRATE 5 MG PO TABS
5.0000 mg | ORAL_TABLET | Freq: Every evening | ORAL | Status: DC | PRN
Start: 2021-08-09 — End: 2021-08-11

## 2021-08-09 MED ORDER — BENZOCAINE-MENTHOL 20-0.5 % EX AERO
1.0000 "application " | INHALATION_SPRAY | CUTANEOUS | Status: DC | PRN
  Administered 2021-08-09: 1 via TOPICAL
  Filled 2021-08-09: qty 56

## 2021-08-09 MED ORDER — MEASLES, MUMPS & RUBELLA VAC IJ SOLR: 0.5000 mL | Freq: Once | INTRAMUSCULAR | Status: DC

## 2021-08-09 MED ORDER — PRENATAL MULTIVITAMIN CH
1.0000 | ORAL_TABLET | Freq: Every day | ORAL | Status: DC
Start: 2021-08-09 — End: 2021-08-11
  Administered 2021-08-09 – 2021-08-11 (×3): 1 via ORAL
  Filled 2021-08-09 (×3): qty 1

## 2021-08-09 MED ORDER — ONDANSETRON HCL 4 MG/2ML IJ SOLN: 4.0000 mg | INTRAMUSCULAR | Status: DC | PRN

## 2021-08-09 MED ORDER — SENNOSIDES-DOCUSATE SODIUM 8.6-50 MG PO TABS
2.0000 | ORAL_TABLET | ORAL | Status: DC
  Administered 2021-08-09 – 2021-08-11 (×3): 2 via ORAL
  Filled 2021-08-09 (×3): qty 2

## 2021-08-09 MED ORDER — DIBUCAINE (PERIANAL) 1 % EX OINT
1.0000 "application " | TOPICAL_OINTMENT | CUTANEOUS | Status: DC | PRN
  Administered 2021-08-10: 1 via RECTAL
  Filled 2021-08-09: qty 28

## 2021-08-09 MED ORDER — SIMETHICONE 80 MG PO CHEW: 80.0000 mg | CHEWABLE_TABLET | ORAL | Status: DC | PRN

## 2021-08-09 MED ORDER — TETANUS-DIPHTH-ACELL PERTUSSIS 5-2.5-18.5 LF-MCG/0.5 IM SUSY: 0.5000 mL | PREFILLED_SYRINGE | Freq: Once | INTRAMUSCULAR | Status: DC

## 2021-08-09 MED ORDER — ONDANSETRON HCL 4 MG PO TABS: 4.0000 mg | ORAL_TABLET | ORAL | Status: DC | PRN

## 2021-08-09 MED ORDER — ACETAMINOPHEN 325 MG PO TABS
650.0000 mg | ORAL_TABLET | ORAL | Status: DC | PRN
  Administered 2021-08-09 – 2021-08-11 (×2): 650 mg via ORAL
  Filled 2021-08-09 (×2): qty 2

## 2021-08-09 NOTE — Progress Notes (Signed)
LABOR PROGRESS NOTE  Sheri Perez is a 30 y.o. G3P2002 at [redacted]w[redacted]d  presented for IOL for severe IUGR.  Subjective: Comfortable with epidural.   Objective: BP 103/61   Pulse 71   Temp 98.3 F (36.8 C) (Oral)   Resp 18   Ht 5\' 2"  (1.575 m)   Wt 76.7 kg   LMP 11/16/2020   SpO2 100%   BMI 30.91 kg/m  or  Vitals:   08/09/21 0201 08/09/21 0231 08/09/21 0301 08/09/21 0332  BP: 132/78 132/82 (!) 142/84 103/61  Pulse: 78 80 87 71  Resp:      Temp:      TempSrc:      SpO2:      Weight:      Height:        Dilation: 5 Effacement (%): 60 Cervical Position: Middle Station: -3 Presentation: Vertex Exam by:: Bria Torrence RN Fetal monitoring: Baseline: 150 bpm, Variability: Good {> 6 bpm), Accelerations: absent, and Decelerations: Absent Uterine activity: Frequency: Every 2-3 minutes  Labs: Lab Results  Component Value Date   WBC 8.1 08/08/2021   HGB 12.4 08/08/2021   HCT 37.2 08/08/2021   MCV 93.5 08/08/2021   PLT 139 (L) 08/08/2021    Patient Active Problem List   Diagnosis Date Noted   Gestational diabetes mellitus (GDM), antepartum 06/14/2021   IUGR (intrauterine growth restriction) affecting care of mother 03/29/2021   Supervision of high risk pregnancy, antepartum 01/26/2021   Back pain    Dyspepsia 01/04/2021   Globus sensation 12/08/2020   Back pain due to injury 11/25/2020   Language barrier 10/15/2017   Family history of diabetes mellitus in father 07/23/2017    Assessment / Plan: 22 y.o. G3P2002 at [redacted]w[redacted]d here for IOL for severe IUGR.  Labor: Continue pitocin. AROM'd for clear/bloody. Fetal Wellbeing:  Category I Pain Control:  Epidural Anticipated MOD:  Vaginal #GBS negative #A1GDM: most recent 79. Continue q4h checks.   [redacted]w[redacted]d, DO 08/09/2021, 4:29 AM PGY-1, [facility]

## 2021-08-09 NOTE — Lactation Note (Signed)
This note was copied from a baby's chart. Lactation Consultation Note  Patient Name: Sheri Perez [id number] Date: 08/09/2021 Reason for consult: L&D Initial assessment;Early term 37-38.6wks Age:32 hours   Initial L&D Consult:  Visited with family < 1 hour after birth Baby awake; offered to assist with latching.  Attempted to latch, however, he was not interested in initiating a suck.  Hand expression done but no colostrum noted.  Reassured parents that lactation services will be available on the M/B unit.  Allowed time for family bonding.     Maternal Data    Feeding Mother's Current Feeding Choice: Breast Milk  LATCH Score                    Lactation Tools Discussed/Used    Interventions Interventions: Assisted with latch;Skin to skin  Discharge    Consult Status Consult Status: Follow-up from L&D    [name] [name] 08/09/2021, 9:30 AM

## 2021-08-09 NOTE — Discharge Summary (Signed)
Postpartum Discharge Summary     Patient Name: Sheri Perez DOB: May 05, 1991 MRN: [id number]  Date of admission: 08/08/2021 Delivery date:08/09/2021  Delivering provider: Gerlene Fee  Date of discharge: 08/11/2021  Admitting diagnosis: IUGR (intrauterine growth restriction) affecting care of mother [O36.5990] Intrauterine pregnancy: [redacted]w[redacted]d    Secondary diagnosis:  Principal Problem:   Vaginal delivery Active Problems:   Language barrier   IUGR (intrauterine growth restriction) affecting care of mother   Gestational diabetes mellitus (GDM), antepartum  Additional problems: None    Discharge diagnosis: Term Pregnancy Delivered and GDM A1                                              Post partum procedures: None Augmentation: AROM, Pitocin, and IP Foley Complications: None  Hospital course: Induction of Labor With Vaginal Delivery   30y.o. yo G3P2002 at 312w1das admitted to the hospital 08/08/2021 for induction of labor.  Indication for induction:  FGR (2.2% EFW with elevated dopplers; also with GDMA1 .  Patient had an uncomplicated labor course as follows: Membrane Rupture Time/Date: 4:20 AM ,08/09/2021   Delivery Method:Vaginal, Spontaneous  Episiotomy: None  Lacerations:  1st degree  Details of delivery can be found in separate delivery note.  Patient had a routine postpartum course and is meeting all milestones. Her PPD#1 fasting CBG was 77. Patient is discharged home 08/11/21.  Newborn Data: Birth date:08/09/2021  Birth time:8:40 AM  Gender:Female  Living status:Living  Apgars:9 ,9  Weight:2190 g (4lb 13.3oz)  Magnesium Sulfate received: No BMZ received: No Rhophylac: N/A MMR: N/A T-DaP: Given prenatally Flu: Yes Transfusion: No  Physical exam  Vitals:   08/10/21 0509 08/10/21 1426 08/10/21 1950 08/11/21 0538  BP: 119/81 118/75 117/67 119/79  Pulse: 76 71 80 91  Resp:  20 18 16   Temp: 98 F (36.7 C) 98.8 F (37.1 C) 98.7 F (37.1 C)  98.4 F (36.9 C)  TempSrc:  Oral Oral Oral  SpO2: 99% 98%    Weight:      Height:       General: alert, cooperative, and no distress Lochia: appropriate Uterine Fundus: firm DVT Evaluation: trace LE edema bilaterally, no calf tenderness to palpation  Labs: Lab Results  Component Value Date   WBC 8.1 08/08/2021   HGB 12.4 08/08/2021   HCT 37.2 08/08/2021   MCV 93.5 08/08/2021   PLT 139 (L) 08/08/2021   CMP Latest Ref Rng & Units 08/08/2021  Glucose 70 - 99 mg/dL 85  BUN 6 - 20 mg/dL -  Creatinine 0.44 - 1.00 mg/dL -  Sodium 135 - 145 mmol/L -  Potassium 3.5 - 5.1 mmol/L -  Chloride 98 - 111 mmol/L -  CO2 22 - 32 mmol/L -  Calcium 8.9 - 10.3 mg/dL -   Edinburgh Score: Edinburgh Postnatal Depression Scale Screening Tool 08/10/2021  I have been able to laugh and see the funny side of things. 3  I have looked forward with enjoyment to things. 0  I have blamed myself unnecessarily when things went wrong. 0  I have been anxious or worried for no good reason. 0  I have felt scared or panicky for no good reason. 1  Things have been getting on top of me. 1  I have been so unhappy that I have had difficulty sleeping. 0  I have  felt sad or miserable. 0  I have been so unhappy that I have been crying. 1  The thought of harming myself has occurred to me. 0  Edinburgh Postnatal Depression Scale Total 6     After visit meds:  Allergies as of 08/11/2021   No Known Allergies      Medication List     STOP taking these medications    Accu-Chek Softclix Lancets lancets   glucose blood test strip   omeprazole 20 MG tablet Commonly known as: PriLOSEC OTC       TAKE these medications    acetaminophen 500 MG tablet Commonly known as: TYLENOL Take 2 tablets (1,000 mg total) by mouth every 8 (eight) hours as needed (pain). What changed:  how much to take when to take this reasons to take this   ibuprofen 600 MG tablet Commonly known as: ADVIL Take 1 tablet (600 mg  total) by mouth every 6 (six) hours as needed (pain).   Prenatal Vitamin 27-0.8 MG Tabs Take 1 tablet by mouth daily. What changed: when to take this         Discharge home in stable condition Infant Feeding: Breast Discharge instruction: per After Visit Summary and Postpartum booklet. Activity: Advance as tolerated. Pelvic rest for 6 weeks.  Diet: routine diet Future Appointments: Future Appointments  Date Time Provider [place]  09/21/2021  2:15 PM Griffin Basil, MD [facility]   Follow up Visit:  Myrtis Ser, CNM  P [facility] Admin Pool Please schedule this patient for Postpartum visit in: 4 weeks with the following provider: Any provider  In-Person  For C/S patients schedule nurse incision check in weeks 2 weeks: no  High risk pregnancy complicated by: GDMA1  Delivery mode:  SVD  Anticipated Birth Control:  Condoms  PP Procedures needed: 2 hour GTT  Schedule Integrated [place] visit: no   08/11/2021 Genia Del, MD

## 2021-08-09 NOTE — Anesthesia Postprocedure Evaluation (Signed)
Anesthesia Post Note  Patient: Sheri Perez  Procedure(s) Performed: AN AD HOC LABOR EPIDURAL     Patient location during evaluation: Mother Baby Anesthesia Type: Epidural Level of consciousness: awake and alert Pain management: pain level controlled Vital Signs Assessment: post-procedure vital signs reviewed and stable Respiratory status: spontaneous breathing, nonlabored ventilation and respiratory function stable Cardiovascular status: stable Postop Assessment: no headache, no backache and epidural receding Anesthetic complications: no   No notable events documented.  Last Vitals:  Vitals:   08/09/21 1841 08/09/21 2017  BP: 127/78 131/83  Pulse: 82 88  Resp: 16 18  Temp: 36.7 C 37.3 C  SpO2:  98%    Last Pain:  Vitals:   08/09/21 2017  TempSrc: Oral  PainSc: 0-No pain   Pain Goal: Patients Stated Pain Goal: 2 (08/08/21 2001)              Epidural/Spinal Function Cutaneous sensation: Normal sensation (08/09/21 2017), Patient able to flex knees: Yes (08/09/21 2017), Patient able to lift hips off bed: Yes (08/09/21 2017), Back pain beyond tenderness at insertion site: No (08/09/21 2017), Progressively worsening motor and/or sensory loss: No (08/09/21 2017), Bowel and/or bladder incontinence post epidural: No (08/09/21 2017)  Romulus Hanrahan

## 2021-08-09 NOTE — Lactation Note (Signed)
This note was copied from a baby's chart. Lactation Consultation Note  Patient Name: Sheri Perez [id number] Date: 08/09/2021 Reason for consult: Initial assessment;Early term 37-38.6wks;Infant < 6lbs;Difficult latch;Other (Comment) (Hypoglycemia and temp instability) Age:32 hours   P3 mother whose infant is now 2 hours old.  This is an ETI at 37+1 weeks weighing < 5 lbs.  Mother breast fed her other two children.  Spanish interpreter, Darrell [phone]) used for interpretation.  In house Spanish interpreter not available due to being in the OR.  Reviewed breast feeding basics with parents and discussed the characteristics of the LPTI.  Provided the LPTI policy guidelines.  Attempted to latch, however, baby was not interested.  Spent no more than 5 minutes with this attempt.  RN in room after this attempt to report a blood sugar lab value of 25 mg/dl.  Worked with the RN over the next hour to provide care for mother and baby.  RN provided glucose gel and donor breast milk.  I provided the education and initiated the DEBP.  #24 flange size is appropriate at this time.  Observed mother pumping for 15 minutes and she was able to obtain 3 mls of EBM.  Since RN had completed a good feeding, I placed the EBM on bedside table for the next feeding.  Parents will feed every 2-3 hours and mother will call for assistance as needed.  If baby shows no interest in latching it would be recommended to go straight to the EBM supplementation followed by donor milk.  Mother will continue to practice hand expression and pump.  Reminded mother to always feed back her EBM before using donor milk supplementation.  Mom made aware of O/P services, breastfeeding support groups, community resources, and our phone # for post-discharge questions.  Information provided in Spanish.  Mother is a [facility] participant in [place].  [facility] referral faxed with mother's permission.  She will follow up tomorrow.  Mother is  familiar with the [facility] rental program since she has used this in the past.  Father present and supportive.   Maternal Data Has patient been taught Hand Expression?: Yes Does the patient have breastfeeding experience prior to this delivery?: Yes  Feeding Mother's Current Feeding Choice: Breast Milk and Donor Milk  LATCH Score Latch: Too sleepy or reluctant, no latch achieved, no sucking elicited.  Audible Swallowing: None  Type of Nipple: Everted at rest and after stimulation  Comfort (Breast/Nipple): Soft / non-tender  Hold (Positioning): Assistance needed to correctly position infant at breast and maintain latch.  LATCH Score: 5   Lactation Tools Discussed/Used    Interventions Interventions: Breast feeding basics reviewed;Assisted with latch;Skin to skin;Hand express;Expressed milk;Position options;Support pillows;Adjust position;DEBP;Education;LC Services brochure  Discharge Pump: DEBP;Manual;Personal (Mother plans to obtain a WIC DEBP) WIC Program: Yes  Consult Status Consult Status: Follow-up Date: 08/10/21 Follow-up type: In-patient    Dora Sims 08/09/2021, 12:13 PM

## 2021-08-09 NOTE — Lactation Note (Addendum)
This note was copied from a baby's chart. Lactation Consultation Note  Patient Name: Boy Kadynce Bonds [id number] Date: 08/09/2021   Age:32 hours  LC checked in with RN, Doran Heater. Infant glucose has stabilized. LC went in to see how pumping going. Mom stated pumped 2x today.  Mom eating dinner at this time. LC to return after meal is completed.   LC talked with Mom with help of spanish translator Lilly. Mom complaining discomfort with 24 flange size. LC placed 24 and 27 on mother, states 24 better fit. RN, Doran Heater to provide coconut oil for her to use prior to pumping.  Mom nipples shown no signs of bruising or abrasions.   Mom for now pumping and bottle feeding offering EBM first followed by fortified DBM. LC reviewed BF supplementation guide for LPTI and Mom aware to offer more if infant not latching at time of feeding.   Mom post pumping with DEBP q 3 hrs for .  Mom to call for latch assistance if infant shows cues to latch before next feeding.   All questions answered at the end of the visit.   Maternal Data    Feeding Nipple Type: Nfant Slow Flow (purple)  LATCH Score                    Lactation Tools Discussed/Used    Interventions    Discharge    Consult Status      Najwa Spillane  Nicholson-Springer 08/09/2021, 7:54 PM

## 2021-08-10 LAB — GLUCOSE, CAPILLARY: Glucose-Capillary: 77 mg/dL (ref 70–99)

## 2021-08-10 NOTE — Progress Notes (Signed)
Pt called out informing me that her feet seem more swollen. I informed her that this is normal and I got her extra pillows and elevated her feet and instructed her to do this for the rest of the night. She called out again to let me know that she had a small clot in the toilet. It was very small maybe the size of a nickel. I checked her fundus and it was firm and scant bleeding. She said she felt a little dizzy so I told her we would dim the lights and get her to rest some. I will continue to monitor

## 2021-08-10 NOTE — Lactation Note (Signed)
This note was copied from a baby's chart. Lactation Consultation Note  Patient Name: Sheri Perez OZDGU'Y Date: 08/10/2021 Age:32 hours Consult was done in Spanish:  LC in to room for follow up. Mother requests some assistance to feed baby. Per mother she wants to give baby some donor milk. LC assisted measuring and warming up donor milk.  Mother states she is concerned due to legs/feet swelling and needs to contact a family member since she is going to need help over night. LC gave mother some privacy and will come back later.     Feeding Nipple Type: Nfant Slow Flow (purple)    [name] 08/10/2021, 9:57 PM

## 2021-08-10 NOTE — Progress Notes (Signed)
POSTPARTUM PROGRESS NOTE  Post Partum Day 1  Subjective:  Taniah Ethelene Hal is a 30 y.o. [id number] s/p VD at [redacted]w[redacted]d.  She reports she is doing well. No acute events overnight. She denies any problems with ambulating, voiding or po intake. Denies nausea or vomiting.  Pain is well controlled.  Lochia is minimal.  Objective: Blood pressure 119/81, pulse 76, temperature 98 F (36.7 C), resp. rate 18, height 5\' 2"  (1.575 m), weight 76.7 kg, last menstrual period 11/16/2020, SpO2 99 %, unknown if currently breastfeeding.  Physical Exam:  General: alert, cooperative and no distress Chest: no respiratory distress Heart:regular rate, distal pulses intact Abdomen: soft, nontender,  Uterine Fundus: firm, appropriately tender DVT Evaluation: No calf swelling or tenderness Extremities: No LE edema Skin: warm, dry  Recent Labs    08/08/21 1227  HGB 12.4  HCT 37.2    Assessment/Plan: Lynze Reddy is a 30 y.o. 38 s/p VD at [redacted]w[redacted]d 2/2 FGR  PPD#1 - Doing well  Routine postpartum care  Contraception: condoms Feeding: Breast Dispo: Plan for discharge 10/27.   LOS: 2 days   11/27, DO 08/10/2021, 9:01 AM PGY-3, [facility]

## 2021-08-10 NOTE — Lactation Note (Signed)
This note was copied from a baby's chart. Lactation Consultation Note  Patient Name: Sheri Perez VVZSM'O Date: 08/10/2021 Reason for consult: Infant < 6lbs;Early term 37-38.6wks;Follow-up assessment Age:32 hours  Consult was done in Spanish: LC back in to room. Baby is latched, cradle position to left breast. Noted lack of support pillows, poor alignment and positioning. Talked about improving latch. Observed a slanted nipple, infant keeps tongue up, LC showed mother.  Explained it seemed unlikely infant is transferring well when latch. LC urged mother to pay attention to tongue when bottlefeeding too.   LC paced bottlefed infant using a extra slow flow nipple (magenta) and infant took ~14 mL of donor milk. Reinforced the need of SLP consult to evaluate infant and provide proper nipple for bottle. Mother has a Nfant purple nipple, LC discouraged use until SLP says it is ok to use.   Encouraged mother to continue pumping every 3 hours. Mother is feeling unwell and showed concerned for swelling.  LC and RN offered reassurance and support.   Maternal Data Has patient been taught Hand Expression?: Yes  Feeding Mother's Current Feeding Choice: Breast Milk and Donor Milk Nipple Type: Extra Slow Flow  Lactation Tools Discussed/Used Tools: Pump;Bottle Breast pump type: Double-Electric Breast Pump Reason for Pumping: stimulation and supplementation Pumping frequency: q3  Interventions Interventions: Pace feeding;Education;DEBP;Hand express;Skin to skin;Breast feeding basics reviewed;Expressed milk  Consult Status Consult Status: Follow-up Date: 08/11/21 Follow-up type: In-patient    Sheri Perez 08/10/2021, 10:59 PM

## 2021-08-11 ENCOUNTER — Other Ambulatory Visit (HOSPITAL_COMMUNITY): Payer: Self-pay

## 2021-08-11 ENCOUNTER — Inpatient Hospital Stay (HOSPITAL_COMMUNITY): Payer: Medicaid Other

## 2021-08-11 LAB — RPR: RPR Ser Ql: NONREACTIVE

## 2021-08-11 MED ORDER — ACETAMINOPHEN 500 MG PO TABS
1000.0000 mg | ORAL_TABLET | Freq: Three times a day (TID) | ORAL | 0 refills | Status: AC | PRN
  Filled 2021-08-11: qty 60, 10d supply, fill #0

## 2021-08-11 MED ORDER — IBUPROFEN 600 MG PO TABS
600.0000 mg | ORAL_TABLET | Freq: Four times a day (QID) | ORAL | 0 refills | Status: AC | PRN
  Filled 2021-08-11: qty 40, 10d supply, fill #0

## 2021-08-11 NOTE — Lactation Note (Signed)
This note was copied from a baby's chart. Lactation Consultation Note  Patient Name: Sheri Perez BDZHG'D Date: 08/11/2021   Age:32 hours  Per the last LC's note, a consult with SLP is recommended to determine best bottle nipple flow rate. Dr. Kennedy Bucker was made aware & she will put in an order. A message was left with SLP. Michelle Nasuti, RN was updated.     [name] [facility] 08/11/2021, 8:52 AM

## 2021-08-12 ENCOUNTER — Ambulatory Visit: Payer: Self-pay

## 2021-08-12 NOTE — Lactation Note (Signed)
This note was copied from a baby's chart. Lactation Consultation Note  Patient Name: Sheri Perez KZLDJ'T Date: 08/12/2021 Reason for consult: Follow-up assessment Age:32 hours   P3 mother whose infant is now 92 hours old.  This is an ETI at 37+1 weeks weighing < 5 lbs.  Mother has breast feeding experience with her other two children.  In house Spanish interpreter, [place], used for interpretation.  Mother is breast feeding and supplementing with her EBM.  Mother feels like she is able to express more milk now that her baby is stable and doing well. She is more relaxed and not so stressed.  Mother has been able to obtain approximately 30 mls. She prefers to use the manual pump instead of the DEBP.  Encouraged mother to continue feeding at least every three hours or sooner if baby shows feeding cues.  Suggested she increase volumes to 40mls+ especially if baby does not have a good breast feeding session.  Mother verbalized understanding.   Mother will be obtaining a DEBP [facility] today.  She has our OP phone number for any further questions/concerns.       Maternal Data    Feeding    LATCH Score                    Lactation Tools Discussed/Used    Interventions Interventions: Education  Discharge Discharge Education: Engorgement and breast care WIC Program: Yes  Consult Status Consult Status: Complete Date: 08/12/21 Follow-up type: Call as needed    Irene Pap Neya Creegan 08/12/2021, 12:51 PM

## 2021-08-20 ENCOUNTER — Telehealth (HOSPITAL_COMMUNITY): Payer: Self-pay | Admitting: *Deleted

## 2021-08-20 ENCOUNTER — Telehealth (HOSPITAL_COMMUNITY): Payer: Self-pay

## 2021-08-20 NOTE — Telephone Encounter (Signed)
   Interpreter line disconnected. Patient still on the line. Patient states I can speak some english. "We are doing ok. Everyone is sick in the house. I am now at the [facility]. I am going to take the baby to the emergency room if baby gets more sick. He just has a cough now." RN told patient to call the pediatrician and speak with them about how baby is doing and the cough. RN told patient to get baby seen by a provider is baby has a fever above 100.4 or any difficulty with breathing.   RN told patient there are some other things I would like to speak with her about and should I call back with an interpreter. She states "can you call back later I am at the store."   [facility] 08/20/2021,1405

## 2021-08-20 NOTE — Telephone Encounter (Signed)
Hospital discharge follow up call completed with Language Line interpreter [phone]. Patient reported that her household is sick. Voiced no specific questions or concerns regarding her own health at this time. EPDS = 1. Patient reported that infant was seen by pediatrician yesterday afternoon. Stated that infant began exhibiting a cough, sneezing, and excess phlegm production last night. Stated infant has remained afebrile. Also reported that infant has had decreased intake. Was taking 35-6ml q feed per patient report, and now only taking 20-79ml with each feed. Patient denied signs of respiratory distress in infant. RN instructed patient to call the pediatric office to speak to the on-call provider. Patient reports that she has the phone number. RN instructed patient that if she is unable to contact someone in the pediatric office, she should take infant to the ER for evaluation. Patient voiced understanding. No other concerns voiced at this time. Deforest Hoyles, RN, 08/20/21, [phone]

## 2021-09-21 ENCOUNTER — Ambulatory Visit (INDEPENDENT_AMBULATORY_CARE_PROVIDER_SITE_OTHER): Payer: Medicaid Other | Admitting: Obstetrics and Gynecology

## 2021-09-21 ENCOUNTER — Other Ambulatory Visit: Payer: Self-pay

## 2021-09-21 ENCOUNTER — Encounter: Payer: Self-pay | Admitting: Obstetrics and Gynecology

## 2021-09-21 NOTE — Progress Notes (Signed)
Post Partum Visit Note  Sheri Perez is a 30 y.o. G4P3003 female who presents for a postpartum visit. She is 6 weeks postpartum following a normal spontaneous vaginal delivery.  I have fully reviewed the prenatal and intrapartum course. The delivery was at 37.1 gestational weeks.  Anesthesia: epidural. Postpartum course has been uncomplicated. Baby is doing well. Baby is feeding by breast. Bleeding changing a maxi pad 3 times a day. Bowel function is normal. Bladder function is normal. Patient is not sexually active. Contraception method is none, pt declines Postpartum depression screening: negative.   The pregnancy intention screening data noted above was reviewed. Potential methods of contraception were discussed. The patient elected to proceed with nothing   Edinburgh Postnatal Depression Scale - 09/21/21 1432       Edinburgh Postnatal Depression Scale:  In the Past 7 Days   I have been able to laugh and see the funny side of things. 0    I have looked forward with enjoyment to things. 0    I have blamed myself unnecessarily when things went wrong. 0    I have been anxious or worried for no good reason. 0    I have felt scared or panicky for no good reason. 0    Things have been getting on top of me. 1    I have been so unhappy that I have had difficulty sleeping. 0    I have felt sad or miserable. 2    I have been so unhappy that I have been crying. 0    The thought of harming myself has occurred to me. 0    Edinburgh Postnatal Depression Scale Total 3             Health Maintenance Due  Topic Date Due   Pneumococcal Vaccine 15-32 Years old (1 - PCV) Never done   URINE MICROALBUMIN  Never done   COVID-19 Vaccine (3 - Booster for Pfizer series) 04/24/2020    The following portions of the patient's history were reviewed and updated as appropriate: allergies, current medications, past family history, past medical history, past social history, past surgical  history, and problem list.  Review of Systems Pertinent items are noted in HPI.  Objective:  BP 108/61   Pulse 67   Wt 157 lb (71.2 kg)   LMP 11/16/2020   Breastfeeding Yes   BMI 28.72 kg/m    General:  alert, cooperative, and no distress   Breasts:  not indicated  Lungs: clear to auscultation bilaterally  Heart:  regular rate and rhythm  Abdomen: soft, non-tender; bowel sounds normal; no masses,  no organomegaly   Wound N/a  GU exam:  not indicated       Assessment:     Encounter for postpartum exam.   Plan:   Essential components of care per ACOG recommendations:  1.  Mood and well being: Patient with negative depression screening today. Reviewed local resources for support.  - Patient tobacco use? No.   - hx of drug use? No.    2. Infant care and feeding:  -Patient currently breastmilk feeding? Yes. Reviewed importance of draining breast regularly to support lactation.  -Social determinants of health (SDOH) reviewed in EPIC.  3. Sexuality, contraception and birth spacing - Patient does not want a pregnancy in the next year.  Desired family size is 3 children.  - Reviewed forms of contraception in tiered fashion. Patient is considering IUD versus nexplanon, information given.  Pt will call  with decision. - Discussed birth spacing of 18 months  4. Sleep and fatigue -Encouraged family/partner/community support of 4 hrs of uninterrupted sleep to help with mood and fatigue  5. Physical Recovery  - Discussed patients delivery and complications. She describes her labor as good. - Patient had a Vaginal, no problems at delivery. Patient had a 1st degree laceration. Perineal healing reviewed. Patient expressed understanding - Patient has urinary incontinence? No. - Patient is safe to resume physical and sexual activity  6.  Health Maintenance - HM due items addressed Yes - Last pap smear  Diagnosis  Date Value Ref Range Status  02/09/2021   Final   - Negative for  intraepithelial lesion or malignancy (NILM)   Pap smear not done at today's visit.  -Breast Cancer screening indicated? No.   7. Chronic Disease/Pregnancy Condition follow up: None  - PCP follow up  Warden Fillers, MD [facility], [facility]

## 2021-10-04 ENCOUNTER — Ambulatory Visit (INDEPENDENT_AMBULATORY_CARE_PROVIDER_SITE_OTHER): Payer: Medicaid Other | Admitting: Internal Medicine

## 2021-10-04 ENCOUNTER — Other Ambulatory Visit: Payer: Self-pay

## 2021-10-04 ENCOUNTER — Encounter: Payer: Self-pay | Admitting: Internal Medicine

## 2021-10-04 VITALS — BP 111/57 | HR 63 | Temp 97.6°F | Ht 62.0 in | Wt 157.8 lb

## 2021-10-04 DIAGNOSIS — O24410 Gestational diabetes mellitus in pregnancy, diet controlled: Secondary | ICD-10-CM | POA: Diagnosis not present

## 2021-10-04 DIAGNOSIS — M549 Dorsalgia, unspecified: Secondary | ICD-10-CM

## 2021-10-04 LAB — POCT GLYCOSYLATED HEMOGLOBIN (HGB A1C): Hemoglobin A1C: 5.7 % — AB (ref 4.0–5.6)

## 2021-10-04 LAB — GLUCOSE, CAPILLARY: Glucose-Capillary: 89 mg/dL (ref 70–99)

## 2021-10-04 NOTE — Patient Instructions (Addendum)
It was a pleasure meeting you today!  Congratulations on the new baby as well!  We checked your diabetes markers today.  I will call you with the results of this.  In the meantime I think it is okay to resume a normal diet but as always I do recommend limiting sugar intake to some degree.  Lets follow-up in about 6 months and recheck your diabetes marker at that time.   Fue un placer conocerte hoy! Felicitaciones por el nuevo beb tambin!  Revisamos sus marcadores de diabetes hoy. Te llamar con los resultados de [place]. Mientras tanto, creo que est bien retomar una dieta normal pero, como siempre, recomiendo limitar la ingesta de Geophysicist/field seismologist cierto punto.  Hagamos un seguimiento en aproximadamente 6 meses y volvamos a revisar su marcador de diabetes en ese momento.
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
## 2021-10-04 NOTE — Assessment & Plan Note (Addendum)
Patient sent from OB/GYN's office for follow-up gestational diabetes.  Last hemoglobin A1c was in April and was 5.6 at that time.  Glucose tolerance test was abnormal/slightly elevated in August, right at the border at the 1 hour mark and less than 155 at the 2-hour mark.  Component Ref Range & Units 4 mo ago 3 yr ago  Glucose, Fasting 65 - 91 mg/dL [id number]  76   Glucose, 1 hour 65 - 179 mg/dL [id number]  092   Glucose, 2 hour 65 - 152 mg/dL 330  076    Hemoglobin A1c 5.7 today, categorized in the prediabetic range.  Recommend rechecking hemoglobin A1c in 6 months and adhering to a carb modified diet
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
## 2021-10-05 NOTE — Progress Notes (Signed)
Internal Medicine Clinic Attending ° °Case discussed with Dr. Rehman  At the time of the visit.  We reviewed the resident’s history and exam and pertinent patient test results.  I agree with the assessment, diagnosis, and plan of care documented in the resident’s note.  ° °

## 2021-12-19 ENCOUNTER — Telehealth: Payer: Self-pay | Admitting: *Deleted

## 2021-12-19 ENCOUNTER — Telehealth: Payer: Self-pay | Admitting: Family Medicine

## 2021-12-19 NOTE — Telephone Encounter (Signed)
I called patient with Interpreter Raquel Mora and left a message I am returning her call, and to call us back if she still has questions. ?Nancy Fetter ?

## 2021-12-19 NOTE — Telephone Encounter (Signed)
Spanish.Sheri KitchenMarland KitchenPatient called and stated that she is having very bad pain in her lower stomach but also very bad pain during sexual intercourse. I have set her up for an appointment but it will not be until the end of April. She would like to speak with a nurse to see if there is anything she can do in the meantime. ?

## 2021-12-19 NOTE — Telephone Encounter (Signed)
Patient returned call from nurse to front desk and asking for call back. ?Staci Acosta ?

## 2021-12-19 NOTE — Telephone Encounter (Signed)
I called [place] with Interpreter [employer]  . She confirms pain in her RLQ= 6 , pain with walk or intercourse. We reviewed her already scheduled appointment. I informed her I can send in Rx for Ibuprofen 800 but she declines, states she has 600mg . I informed her she can call registrar occasionally to see it there has been a cancellation and can come in sooner. I explained if her pain becomes severe she should go to the hospital . She voices understanding.  ? ?

## 2022-02-06 ENCOUNTER — Ambulatory Visit: Payer: Medicaid Other | Admitting: Obstetrics and Gynecology

## 2022-03-31 ENCOUNTER — Ambulatory Visit: Payer: Medicaid Other | Admitting: Obstetrics & Gynecology

## 2022-04-13 ENCOUNTER — Ambulatory Visit: Payer: Medicaid Other | Admitting: Student

## 2023-07-04 ENCOUNTER — Encounter: Payer: Self-pay | Admitting: Student

## 2023-07-04 ENCOUNTER — Ambulatory Visit (INDEPENDENT_AMBULATORY_CARE_PROVIDER_SITE_OTHER): Payer: Medicaid Other | Admitting: Student

## 2023-07-04 VITALS — BP 111/64 | HR 65 | Temp 98.5°F | Wt 167.1 lb

## 2023-07-04 DIAGNOSIS — Z3A00 Weeks of gestation of pregnancy not specified: Secondary | ICD-10-CM | POA: Diagnosis not present

## 2023-07-04 DIAGNOSIS — O24410 Gestational diabetes mellitus in pregnancy, diet controlled: Secondary | ICD-10-CM | POA: Diagnosis present

## 2023-07-04 DIAGNOSIS — K30 Functional dyspepsia: Secondary | ICD-10-CM

## 2023-07-04 DIAGNOSIS — R1013 Epigastric pain: Secondary | ICD-10-CM | POA: Diagnosis not present

## 2023-07-04 DIAGNOSIS — R635 Abnormal weight gain: Secondary | ICD-10-CM | POA: Diagnosis not present

## 2023-07-04 LAB — POCT GLYCOSYLATED HEMOGLOBIN (HGB A1C): Hemoglobin A1C: 5.6 % (ref 4.0–5.6)

## 2023-07-04 LAB — GLUCOSE, CAPILLARY: Glucose-Capillary: 83 mg/dL (ref 70–99)

## 2023-07-04 NOTE — Assessment & Plan Note (Addendum)
Patient describes having abdominal pain, bloating, and diarrhea for the past month.  She says it worsens when she eats. She reports 4-5 nonbloody episodes of diarrhea per day, often immediately after she eats a meal. She also describes a generalized bloating, nausea, and dull discomfort predominantly in the left upper and lower quadrants of her abdomen. She denies any vomiting, recent fever or illnesses, weight loss.  She has been taking Imodium, which she says has helped with her diarrhea symptoms.  She takes omeprazole for her acid reflux, but only takes it occasionally, and says she has not taken in the past 2 weeks.  She says this pain feels different than her previous acid reflux pain. Differential includes H. pylori, cholelithiasis, IBS, celiac disease.  Plan: - H. pylori testing today - CBC, CMP, lipids - Ordered right upper quadrant ultrasound for evaluation of the gallbladder - Follow up in 1 month

## 2023-07-04 NOTE — Patient Instructions (Addendum)
Joesph July. Melani Moncada-Aguilar, por permitirnos brindarle su atencin hoy. Hoy hablamos de su dolor abdominal y [place].  Hoy nos realizaremos algunas pruebas de laboratorio, incluida una prueba para H. pylori, una bacteria que puede causar sntomas abdominales y [employer] que usted ha estado experimentando.  Le informaremos los [employer] de la prueba tan pronto como hoy.  Tambin hemos ordenado una ecografa abdominal en el cuadrante superior derecho para evaluar su vescula biliar.  A veces, los clculos pueden atascarse en el conducto de la vescula biliar cuando se contrae despus de comer, lo que puede causar algunos de los sntomas que ha estado experimentando.  Como comentamos, su hemoglobina A1c hoy era 5,6, lo cual es normal.   He remitido a Armed forces technical officer nutricionista para hablar sobre la prdida de [place]. Deberan comunicarse con usted para programar una cita.   He ordenado los siguientes laboratorios para usted:  rdenes de laboratorio         Glucosa capilar         Perfil lipdico         CBC sin diferencia         CMP14 + brecha aninica         Prueba de aliento para H. pylori         POC Hbg A1C      Referencias ordenadas hoy:   rdenes de referencia         Derivacin ambulatoria a Nutricin y Careers information officer Diabtica      Seguimiento: 1 mes  Esperamos verte la prxima vez. Llame a nuestra clnica al [phone] si tiene alguna pregunta o inquietud. El mejor momento para llamar es de lunes a viernes de 9 a. m. a 4 p. m., pero hay alguien disponible las 24 horas, los 7 [address]  [address] de la [place]. Si es fuera del horario de atencin o durante el fin de [place], llame al nmero principal del hospital y pregunte por el residente de guardia de medicina interna. Si necesita reabastecimiento de medicamentos, notifique a su farmacia con una semana de anticipacin y ellos nos enviarn una solicitud.   Gracias por confiarme tu cuidado. Te deseo lo mejor!   Annett Fabian,  MD [facility]
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
## 2023-07-04 NOTE — Assessment & Plan Note (Addendum)
Patient has a history of gestational diabetes. Last A1c was 5.7 in 2022. Repeat A1c today is 5.6.

## 2023-07-04 NOTE — Assessment & Plan Note (Signed)
Patient reports she has gained 8 pounds this month. She says she is more active than usual and has been eating less, so she is confused why she has been gaining weight. She is not currently taking any birth control. I have placed a referral for an appointment with a nutritionist to discuss weight loss strategies.

## 2023-07-05 LAB — CMP14 + ANION GAP
ALT: 29 IU/L (ref 0–32)
AST: 25 IU/L (ref 0–40)
Albumin: 4.5 g/dL (ref 3.9–4.9)
Alkaline Phosphatase: 72 IU/L (ref 44–121)
Anion Gap: 15 mmol/L (ref 10.0–18.0)
BUN/Creatinine Ratio: 15 (ref 9–23)
BUN: 9 mg/dL (ref 6–20)
Bilirubin Total: 0.3 mg/dL (ref 0.0–1.2)
CO2: 20 mmol/L (ref 20–29)
Calcium: 8.8 mg/dL (ref 8.7–10.2)
Chloride: 104 mmol/L (ref 96–106)
Creatinine, Ser: 0.61 mg/dL (ref 0.57–1.00)
Globulin, Total: 2.5 g/dL (ref 1.5–4.5)
Glucose: 80 mg/dL (ref 70–99)
Potassium: 4.2 mmol/L (ref 3.5–5.2)
Sodium: 139 mmol/L (ref 134–144)
Total Protein: 7 g/dL (ref 6.0–8.5)
eGFR: 121 mL/min/{1.73_m2} (ref >59)

## 2023-07-05 LAB — CBC
Hematocrit: 39.8 % (ref 34.0–46.6)
Hemoglobin: 12.5 g/dL (ref 11.1–15.9)
MCH: 28.2 pg (ref 26.6–33.0)
MCHC: 31.4 g/dL — ABNORMAL LOW (ref 31.5–35.7)
MCV: 90 fL (ref 79–97)
Platelets: 246 10*3/uL (ref 150–450)
RBC: 4.43 x10E6/uL (ref 3.77–5.28)
RDW: 14 % (ref 11.7–15.4)
WBC: 7.9 10*3/uL (ref 3.4–10.8)

## 2023-07-05 LAB — LIPID PANEL
Chol/HDL Ratio: 2.7 ratio (ref 0.0–4.4)
Cholesterol, Total: 159 mg/dL (ref 100–199)
HDL: 60 mg/dL (ref >39)
LDL Chol Calc (NIH): 88 mg/dL (ref 0–99)
Triglycerides: 54 mg/dL (ref 0–149)
VLDL Cholesterol Cal: 11 mg/dL (ref 5–40)

## 2023-07-05 NOTE — Progress Notes (Signed)
Internal Medicine Clinic Attending  I was physically present during the key portions of the resident provided service and participated in the medical decision making of patient's management care. I reviewed pertinent patient test results.  The assessment, diagnosis, and plan were formulated together and I agree with the documentation in the resident's note.  Erlinda Hong, MD FACP

## 2023-07-06 LAB — H. PYLORI BREATH TEST: H pylori Breath Test: NEGATIVE

## 2023-07-23 ENCOUNTER — Ambulatory Visit (HOSPITAL_COMMUNITY)
Admission: RE | Admit: 2023-07-23 | Discharge: 2023-07-23 | Disposition: A | Discharge status: Home / Self Care | Payer: Medicaid Other | Source: Ambulatory Visit | Attending: Student in an Organized Health Care Education/Training Program | Admitting: Student in an Organized Health Care Education/Training Program

## 2023-07-23 DIAGNOSIS — R1013 Epigastric pain: Secondary | ICD-10-CM | POA: Diagnosis present

## 2023-08-13 ENCOUNTER — Telehealth: Payer: Self-pay | Admitting: Dietician

## 2023-08-13 NOTE — Telephone Encounter (Signed)
Called patient using interpreter [phone] to let her know her appointment for tomorrow has been cancelled and she should be hearing from the department where is was sent. If they leave her a message, she should return the call to schedule an appointment.

## 2023-08-14 ENCOUNTER — Encounter: Payer: Medicaid Other | Admitting: Dietician

## 2023-09-26 ENCOUNTER — Encounter: Payer: Medicaid Other | Attending: Internal Medicine | Admitting: Dietician

## 2023-09-26 ENCOUNTER — Encounter: Payer: Self-pay | Admitting: Dietician

## 2023-09-26 VITALS — Wt 166.1 lb

## 2023-09-26 DIAGNOSIS — R635 Abnormal weight gain: Secondary | ICD-10-CM | POA: Insufficient documentation
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
## 2023-09-26 NOTE — Patient Instructions (Addendum)
Objetivos:  comenzar con 30 minutos al da, [address] a la semana de [place].  disminuir la cantidad de cereales en el plato (aproximadamente 1/2 taza de cereal o vegetal con almidn O 2 piezas de pan/tortilla), y agregar ms verduras no almidonadas (1+ tazas por comida). ___________________________________ Goals: 1) start 30 minutes a day at 4 days a week of physical activity  2) decrease the amount of grains on plte (about 1/2 cup  or grain or starchy vegetable OR 2 pieces of bread/tortilla), and add more non-starchy vegetables (1+ cups per meal)

## 2023-10-01 ENCOUNTER — Encounter: Payer: Self-pay | Admitting: Student

## 2023-10-01 ENCOUNTER — Ambulatory Visit: Payer: Medicaid Other | Admitting: Student

## 2023-10-01 VITALS — BP 109/68 | HR 64 | Temp 98.2°F | Ht 62.0 in | Wt 168.4 lb

## 2023-10-01 DIAGNOSIS — R519 Headache, unspecified: Secondary | ICD-10-CM

## 2023-10-01 NOTE — Patient Instructions (Addendum)
Sheri Perez. Sheri Perez, por permitirnos brindarle su atencin hoy. Hoy hablamos de su dolor de [place] y de piernas.  -Orden una resonancia magntica del cerebro para verificar si hay algn cambio, lo llamarn para programarlo. -Pruebe ibuprofeno 800 mg hasta tres veces al da segn sea necesario para Chief Technology Officer. -Puede agregar Tylenol 1000 hasta tres veces al da segn sea necesario para Chief Technology Officer.  -Si sus sntomas empeoran o desarrolla nuevos sntomas, vaya a la sala de emergencias.    Seguimiento: 2-3 semanas    Si tiene alguna pregunta o inquietud, llame a la clnica de medicina interna al [phone].    Rana Snare, D.O. [facility]  -----------------------------------------  Thank you, Sheri Perez for allowing [place] to provide your care today. Today we discussed your headache and leg pains.  -Ordered MRI brain to check for any changes, they will call you to schedule it -Try ibuprofen 800 mg up to three times a day as needed for pain -Can add on Tylenol 1000 up to three times a day as needed for pain  -If you symptoms worsen or develop new symptoms, please go to the emergency room    Follow up:  2-3 weeks    Should you have any questions or concerns please call the internal medicine clinic at [phone].    Rana Snare, D.O. [facility]
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
## 2023-10-08 NOTE — Progress Notes (Signed)
Internal Medicine Clinic Attending  Case discussed with the resident at the time of the visit.  We reviewed the resident's history and exam and pertinent patient test results.  I agree with the assessment, diagnosis, and plan of care documented in the resident's note.  

## 2023-10-09 ENCOUNTER — Ambulatory Visit (HOSPITAL_COMMUNITY)
Admission: RE | Admit: 2023-10-09 | Discharge: 2023-10-09 | Disposition: A | Discharge status: Home / Self Care | Payer: Medicaid Other | Source: Ambulatory Visit | Attending: Internal Medicine | Admitting: Internal Medicine

## 2023-10-09 DIAGNOSIS — R519 Headache, unspecified: Secondary | ICD-10-CM | POA: Insufficient documentation

## 2023-11-12 ENCOUNTER — Ambulatory Visit: Payer: Medicaid Other | Admitting: Dietician

## 2023-11-15 ENCOUNTER — Ambulatory Visit: Payer: Medicaid Other | Admitting: Student

## 2023-11-15 VITALS — BP 117/70 | HR 78 | Temp 98.5°F | Ht 62.0 in | Wt 171.8 lb

## 2023-11-15 DIAGNOSIS — M5430 Sciatica, unspecified side: Secondary | ICD-10-CM | POA: Diagnosis not present

## 2023-11-15 DIAGNOSIS — R519 Headache, unspecified: Secondary | ICD-10-CM

## 2023-11-15 DIAGNOSIS — R635 Abnormal weight gain: Secondary | ICD-10-CM

## 2023-11-15 MED ORDER — PROPRANOLOL HCL 20 MG PO TABS: 40.0000 mg | ORAL_TABLET | ORAL | 0 refills | Status: DC | PRN

## 2023-11-15 MED ORDER — GABAPENTIN 300 MG PO CAPS: 300.0000 mg | ORAL_CAPSULE | Freq: Every day | ORAL | 2 refills | Status: DC

## 2023-11-15 NOTE — Patient Instructions (Signed)
Sheri Perez, Sheri Perez por permitirnos brindarle atencin hoy.   He ordenado los siguientes laboratorios para usted:  Lab Orders  No laboratory test(s) ordered today     Pruebas ordenadas hoy:  Referencias ordenadas hoy:  Referral Orders  No referral(s) requested today     He ordenado el siguiente medicamento/cambiado los siguientes medicamentos:  Suspender los siguientes medicamentos: There are no discontinued medications.   Iniciar los siguientes medicamentos: Meds ordered this encounter  Medications   propranolol (INDERAL) 20 MG tablet    Sig: Take 2 tablets (40 mg total) by mouth as needed (Take 30-60 minutes prior to intimate activity/inciting event).    Dispense:  30 tablet    Refill:  0    Please provide instructions in Spanish   gabapentin (NEURONTIN) 300 MG capsule    Sig: Take 1 capsule (300 mg total) by mouth at bedtime.    Dispense:  30 capsule    Refill:  2     Seguimiento 1 mes/1 month for headache, weight loss, sciatica   Recordar:   Para los dolores de cabeza:  - Tomar propranolol 40 mg 30-60 minutos antes de tener relaciones intimas. Tomar Ibuprofen 800 mg y Tylenol 1000 mg si se desarrolla el dolor de cabeza.  - Mantener un diario con los sintomas y cuanto tiempo duran los sintomas.   Para el dolor de nervios:  - Tomar una pastilla de gabapentin 300 mg cada noche antes de dormir. Este medicamento puede hacerte sentir cancansio y puede ponerte en riesgo de caidas, por eso se recomienda tomar en la noche antes de dormir.   Para la perdida de peso:  - Puedes obtener una membrecia a bajo costo en la [employer] con Medicaid y tomar clases - Hay un gimnasio que se llama [facility] cerca del centro medico [facility] adonde tambien te dan un descuento y puedes tomar clases de ejercicio  - Puedes hablar sobre la combinacion de medicamento con "phentermine" y "topiramate" en tu proxima cita en un mes.    Si tiene alguna pregunta o inquietud, llame a  la clnica de medicina interna al [phone].    [name], MD PGY-1 Internal Medicine Teaching Program  [facility]
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
## 2023-11-17 DIAGNOSIS — M5430 Sciatica, unspecified side: Secondary | ICD-10-CM | POA: Insufficient documentation

## 2023-11-17 NOTE — Assessment & Plan Note (Signed)
Patient clarified nature of headaches. States episode that brought her in to her 10/01/2023 visit started while she and her husband were being intimate. She tried sharing this with Dr. Sherrilee Gilles during the visit but felt very uncomfortable as in-person interpreter during that visit seemed to laugh when the patient began to discuss it. Thanked patient for sharing that important details. Clarified that after the initial pain, which was mostly localized to the left temple, intense pain subsided but continued for several days. Had another episode which also started when she and her husband were being intimate. It was again localized to the left temple and after 5 minutes of intense pain it subsided and lasted for a few more days. Patient now hesitant to initiate intercourse or intimate session with husband. Has been following advise to take ibuprofen with Tylenol for pain management but pain continues to last for days after starting. Denies nausea, vomiting, vision changes, numbness, weakness, confusion, or speech changes. Does endorse some tearing in her left eye and the sensation of ants crawling on her skin.   Discussed possible etiology of headache with Dr. Sol Blazing given atypical presentation and inciting factor seemingly being intimate sessions with husband. There is a headache associated during or shortly after sexual arousal. It can be treated with beta blocker rather than abortive therapy with triptans associated with migraines. Patient willing to try beta blocker, will keep headache diary, and will have close follow up in clinic.  Plan - Patient instructed to take propranolol 40 mg 30-60 minutes before initiating sex/intimate encounter - Can continue to take ibuprofen 800 mg and Tylenol 1000 mg q6-8h as needed if headache develops - Patient to keep headache diary to note any symptoms, timing, and associations - Return to clinic in 1 month for follow up

## 2023-11-17 NOTE — Assessment & Plan Note (Signed)
167 lbs in September, 171 today. Patient with history of weight gain, has tried working out and eating better but feels like she is not making any progress. Did talk to a nutritionist but didn't feel like it was very useful. Sciatica also limits her ability to participate in exercise. Discussed options, including joining classes at the [facility] or [employer] using Medicaid discount, patient interested. Also discussed medication assisted weight loss. Does not qualify for GLP-1 agonist.  Patient provided with information on phentermine and topiramate for weight loss.  Plan:  - Patient to read on phentermine and topiramate therapy, consider starting in the future if appropriate - Patient to apply for either [employer] membership or [employer] to join classes
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
## 2023-11-19 ENCOUNTER — Other Ambulatory Visit: Payer: Self-pay | Admitting: Student

## 2023-11-19 NOTE — Progress Notes (Signed)
 Internal Medicine Clinic Attending  Case discussed with the resident at the time of the visit.  We reviewed the resident's history and exam and pertinent patient test results.  I agree with the assessment, diagnosis, and plan of care documented in the resident's note.

## 2023-12-14 ENCOUNTER — Encounter: Payer: Medicaid Other | Admitting: Student

## 2023-12-18 ENCOUNTER — Ambulatory Visit: Payer: Medicaid Other | Admitting: Student

## 2023-12-18 ENCOUNTER — Encounter: Payer: Self-pay | Admitting: Student

## 2023-12-18 ENCOUNTER — Telehealth: Payer: Self-pay

## 2023-12-18 DIAGNOSIS — M5430 Sciatica, unspecified side: Secondary | ICD-10-CM

## 2023-12-18 DIAGNOSIS — R519 Headache, unspecified: Secondary | ICD-10-CM | POA: Diagnosis not present

## 2023-12-18 DIAGNOSIS — R635 Abnormal weight gain: Secondary | ICD-10-CM | POA: Diagnosis present

## 2023-12-18 MED ORDER — SEMAGLUTIDE-WEIGHT MANAGEMENT 1.7 MG/0.75ML ~~LOC~~ SOAJ: 1.7000 mg | SUBCUTANEOUS | 0 refills | Status: AC

## 2023-12-18 MED ORDER — SEMAGLUTIDE-WEIGHT MANAGEMENT 0.5 MG/0.5ML ~~LOC~~ SOAJ: 0.5000 mg | SUBCUTANEOUS | 0 refills | Status: AC

## 2023-12-18 MED ORDER — SEMAGLUTIDE-WEIGHT MANAGEMENT 2.4 MG/0.75ML ~~LOC~~ SOAJ: 2.4000 mg | SUBCUTANEOUS | 0 refills | Status: DC

## 2023-12-18 MED ORDER — SEMAGLUTIDE-WEIGHT MANAGEMENT 0.25 MG/0.5ML ~~LOC~~ SOAJ: 0.2500 mg | SUBCUTANEOUS | 0 refills | Status: AC

## 2023-12-18 MED ORDER — SEMAGLUTIDE-WEIGHT MANAGEMENT 1 MG/0.5ML ~~LOC~~ SOAJ: 1.0000 mg | SUBCUTANEOUS | 0 refills | Status: AC
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
## 2023-12-18 NOTE — Progress Notes (Signed)
 Internal Medicine Clinic Attending  Case discussed with the resident at the time of the visit.  We reviewed the resident's history and exam and pertinent patient test results.  I agree with the assessment, diagnosis, and plan of care documented in the resident's note.

## 2023-12-18 NOTE — Telephone Encounter (Signed)
 Prior Authorization for patient [facility] 0.25MG /0.5ML auto-injector) came through on cover my meds was submitted with last office notes awaiting approval or denial.  ZOX:[id number]

## 2023-12-18 NOTE — Assessment & Plan Note (Addendum)
 History of headaches associated with sexual intercourse.  She states that this is totally resolved with just the gabapentin nightly.  She tried propranolol once, but otherwise has not been taking it.  She has not had any recurrent headaches with sexual intercourse.

## 2023-12-18 NOTE — Assessment & Plan Note (Addendum)
 Estimated body mass index is 31.42 kg/m as calculated from the following:   Height as of 11/15/23: 5\' 2"  (1.575 m).   Weight as of 11/15/23: 171 lb 12.8 oz (77.9 kg).  She has been actively making dietary changes to eat better and recently met with a nutritionist.  She has also been exercising as maximally tolerated.  Despite this, she has not been losing any weight.  She plans to continue these activities.  She does not have any personal or family history of thyroid disease or cancer, MEN syndromes, or pancreatitis.   I will start her on Wegovy 0.25 mg weekly and we will see her again in 4 weeks to follow-up on how this medication has been tolerated can consider increasing to the next dose.
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
## 2023-12-18 NOTE — Telephone Encounter (Signed)
 Sheri Perez (Key: [id number]) Rx #: [id number] BJYNWG 0.25MG /0.5ML auto-injectors Form [employer] Electronic Prior Authorization Form (2017 NCPDP) Created Your prior authorization for [name] has been approved! More Info Personalized support and financial assistance may be available through the [employer] program. For more information, and to see program requirements, click on the More Info button to the right.  Message from plan: Request Reference Number: [id number]. WEGOVY INJ 0.25MG  is approved through 06/19/2024. For further questions, call [employer] at [phone].[name] Authorization Expiration Date: June 19, 2024.

## 2024-01-10 ENCOUNTER — Other Ambulatory Visit: Payer: Self-pay | Admitting: Student

## 2024-01-10 NOTE — Telephone Encounter (Signed)
 Copied from CRM [id number]. Topic: Clinical - Medication Refill >> Jan 10, 2024  3:16 PM Dennison Nancy wrote: Most Recent Primary Care Visit:  Provider: Annett Fabian  Department: [facility]  Visit Type: TELEPHONE OFFICE VISIT  Date: 12/18/2023  Medication: Semaglutide-Weight Management 0.5 MG/0.5ML SOAJ (Starting on 01/16/2024) Has the patient contacted their pharmacy? Yes (Agent: If no, request that the patient contact the pharmacy for the refill. If patient does not wish to contact the pharmacy document the reason why and proceed with request.) (Agent: If yes, when and what did the pharmacy advise?)  Is this the correct pharmacy for this prescription? Yes , patient has to contact her provider  If no, delete pharmacy and type the correct one.  This is the patient's preferred pharmacy: [id number] [place], [place] - [address] [place] [address] Phone: [phone] Fax: [phone]   Has the prescription been filled recently? Yes  Is the patient out of the medication? No , patient almost out of the medication and can not miss a dose   Has the patient been seen for an appointment in the last year OR does the patient have an upcoming appointment? Yes , patient is schedule for a followup for refill on 01/17/24 is the soonest appointment available   Can we respond through MyChart? Yes  Agent: Please be advised that Rx refills may take up to 3 business days. We ask that you follow-up with your pharmacy.

## 2024-01-17 ENCOUNTER — Ambulatory Visit: Payer: Self-pay | Admitting: Student

## 2024-01-17 DIAGNOSIS — R635 Abnormal weight gain: Secondary | ICD-10-CM | POA: Diagnosis present

## 2024-01-17 NOTE — Progress Notes (Signed)
 Internal Medicine Clinic Attending  Case discussed with the resident at the time of the visit.  We reviewed the resident's history and exam and pertinent patient test results.  I agree with the assessment, diagnosis, and plan of care documented in the resident's note.

## 2024-01-17 NOTE — Assessment & Plan Note (Signed)
 Patient recently started Wegovy 0.25 mg/week for weight management.  The patient notes a 7 pound weight loss over the past month.  She denies any side effects of the medication.  At this time, will increase to 0.5 mg/week for 4 weeks.  Will follow-up again in 4 weeks, and if she is able to tolerate this dose with no side effects, we will likely increase to 1 mg/week. - Increase Wegovy to 0.5 mg/week

## 2024-01-17 NOTE — Patient Instructions (Signed)
 Thank you so much for coming to the clinic today!   We will see you in 4 weeks.   If you have any questions please feel free to the call the clinic at anytime at [phone]. It was a pleasure seeing you!  Best, Dr. Rayvon Char

## 2024-01-17 NOTE — Addendum Note (Signed)
 Addended by: [name] on: 01/17/2024 12:05 PM   Modules accepted: Level of Service
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
## 2024-02-06 ENCOUNTER — Telehealth: Payer: Self-pay | Admitting: Student

## 2024-02-06 NOTE — Telephone Encounter (Signed)
 Please refer to message below.  Copied from CRM [id number]. Topic: Appointments - Scheduling Inquiry for Clinic >> Feb 05, 2024  4:35 PM Sheri Perez wrote: Reason for CRM: Patient wants to know if appointment scheduled on 4/25 at 10:45 can be switched to virtual appointment

## 2024-02-08 ENCOUNTER — Telehealth: Admitting: Student

## 2024-02-08 DIAGNOSIS — Z309 Encounter for contraceptive management, unspecified: Secondary | ICD-10-CM

## 2024-02-08 DIAGNOSIS — R635 Abnormal weight gain: Secondary | ICD-10-CM
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
## 2024-02-11 DIAGNOSIS — Z309 Encounter for contraceptive management, unspecified: Secondary | ICD-10-CM | POA: Insufficient documentation

## 2024-02-11 NOTE — Assessment & Plan Note (Addendum)
 Patient's current birth control method is condom use. She would like to explore other options. Discussed previous birth control methods the patient has tried, including OCPs, IUD, nexplanon, depo provera, and vaginal ring. Patient does have a history of headaches associated with sexual activity. Reviewed pros and cons of progestin only pills. Patient is interested in discussing all options with OBGYN since she has experienced unwanted side effects with almost all methods, agreeable to referral today.  Plan - OBGYN referral, will follow up as needed

## 2024-02-11 NOTE — Assessment & Plan Note (Signed)
 Patient has been on Wegovy  0.5 mg injections for about 3 weeks. Denies any nausea, vomiting, diarrhea, constipation, or injection site reactions. Does endorse occasionally looser stools but nothing too bothersome. States that about a week ago she did experience increased burping and bloating that may have been due to what she ate or the Wegovy  injection, but symptoms have improved and she would like to continue titrating up her dose. Is very happy with weight loss so far, stating she has gone from 174 lbs to about 163 lbs today (weighed self at home). Confirmed she is not taking any additional medications at this time (no propranolol  or gabapentin ).  Plan - Increase Wegovy  to 1 mg/week  - Follow up in 1 month to determine if ok to increase to 1.7 mg/week

## 2024-03-13 ENCOUNTER — Telehealth: Payer: Self-pay | Admitting: Student

## 2024-03-13 NOTE — Telephone Encounter (Signed)
 Spoke with the patient.   Name: Sheri Perez, Sheri Perez MRN: [id number]  Date: 03/19/2024 Status: Sch  Time: 3:45 PM Length: 30  Visit Type: OPEN ESTABLISHED [726] Copay: $0.00  Provider: Dorthy Gavia, MD       Copied from CRM [id number]. Topic: Appointments - Scheduling Inquiry for Clinic >> Mar 11, 2024  1:13 PM Brynn Caras wrote: Reason for CRM: The patient is requesting to schedule her weight-management follow up visit as a telehealth appointment. She would like to be scheduled anytime this week preferably, she states she will be completely out of her weight medication by Monday.  Callback [phone]

## 2024-03-19 ENCOUNTER — Encounter: Payer: Self-pay | Admitting: Internal Medicine

## 2024-03-19 ENCOUNTER — Ambulatory Visit (INDEPENDENT_AMBULATORY_CARE_PROVIDER_SITE_OTHER): Admitting: Internal Medicine

## 2024-03-19 VITALS — BP 107/75 | HR 82 | Temp 98.1°F | Ht 62.0 in | Wt 163.8 lb

## 2024-03-19 DIAGNOSIS — R635 Abnormal weight gain: Secondary | ICD-10-CM | POA: Diagnosis present

## 2024-03-19 DIAGNOSIS — M5430 Sciatica, unspecified side: Secondary | ICD-10-CM

## 2024-03-19 MED ORDER — GABAPENTIN 300 MG PO CAPS: 300.0000 mg | ORAL_CAPSULE | Freq: Every day | ORAL | 0 refills | Status: AC

## 2024-03-19 NOTE — Patient Instructions (Addendum)
 Sheri Perez,  Fue un placer conocerla hoy. Estoy aumentando su dosis de Wegovy  a 1.7 mg semanales. Planificaremos una consulta telefnica dentro de un mes para ver cmo le est funcionando esta dosis. Si no tiene ningn problema, podemos aumentarla a la dosis mxima. Tambin le estoy recetando gabapentina para la citica.  Gracias, Dr. Alana Allan

## 2024-03-19 NOTE — Assessment & Plan Note (Addendum)
 Patient reports intermittent flare ups of her sciatica. She was previously prescribed gabapentin  for this, which she would take once daily at night.  Would like a refill of this prescription today - Ordered gabapentin  300 mg for 30 doses to be taken as needed at night
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
## 2024-03-19 NOTE — Assessment & Plan Note (Addendum)
 Patient was started on Wegovy  approximately 3 months ago. Patient's Wegovy  dose was increased to 1mg  weekly on 4/28. She has been tolerating this dose well with minimal side effects. She reports a weight loss of approximately... She would like to continue uptitrating the dose if possible.  - Increase Wegovy  to 1.7mg /week - Virtual visit in 1 month to evaluate symptoms and increase to max dose 2.4mg  weekly if appropriate

## 2024-03-21 ENCOUNTER — Encounter: Payer: Self-pay | Admitting: *Deleted

## 2024-03-24 NOTE — Progress Notes (Signed)
 Internal Medicine Clinic Attending  Case discussed with the resident at the time of the visit.  We reviewed the resident's history and exam and pertinent patient test results.  I agree with the assessment, diagnosis, and plan of care documented in the resident's note.

## 2024-03-25 ENCOUNTER — Ambulatory Visit: Admitting: Obstetrics and Gynecology

## 2024-05-06 ENCOUNTER — Other Ambulatory Visit: Payer: Self-pay

## 2024-05-06 MED ORDER — SEMAGLUTIDE-WEIGHT MANAGEMENT 2.4 MG/0.75ML ~~LOC~~ SOAJ: 2.4000 mg | SUBCUTANEOUS | 0 refills | Status: DC

## 2024-06-02 ENCOUNTER — Other Ambulatory Visit: Payer: Self-pay | Admitting: Student

## 2024-07-09 ENCOUNTER — Other Ambulatory Visit: Payer: Self-pay

## 2024-07-09 NOTE — Telephone Encounter (Unsigned)
 Copied from CRM [id number]. Topic: Clinical - Medication Refill >> Jul 09, 2024 12:20 PM Shamecia H wrote: Medication: semaglutide -weight management (WEGOVY ) 2.4 MG/0.75ML SOAJ SQ injection  Has the patient contacted their pharmacy? Yes (Agent: If no, request that the patient contact the pharmacy for the refill. If patient does not wish to contact the pharmacy document the reason why and proceed with request.) (Agent: If yes, when and what did the pharmacy advise?)  This is the patient's preferred pharmacy:  [id number] [place], [place] - [address] [place] [address] Phone: [phone] Fax: [phone]  Is this the correct pharmacy for this prescription? Yes If no, delete pharmacy and type the correct one.   Has the prescription been filled recently? Yes  Is the patient out of the medication? Yes  Has the patient been seen for an appointment in the last year OR does the patient have an upcoming appointment? Yes  Can we respond through MyChart? Yes  Agent: Please be advised that Rx refills may take up to 3 business days. We ask that you follow-up with your pharmacy.

## 2024-07-10 MED ORDER — WEGOVY 2.4 MG/0.75ML ~~LOC~~ SOAJ: 2.4000 mg | SUBCUTANEOUS | 1 refills | Status: DC

## 2024-07-14 ENCOUNTER — Telehealth: Payer: Self-pay

## 2024-07-14 NOTE — Telephone Encounter (Signed)
 Per cover my meds  Sheri Perez (KeyBETHA FREI) Rx #: [id number] Wegovy  2.4MG /0.75ML auto-injectors Form [employer] Electronic Prior Authorization Form (2017 NCPDP) Created 4 days ago Sent to Plan Determination Favorable 1 hour ago Your prior authorization for Wegovy  has been approved! More Info Personalized support and financial assistance may be available through the [employer] program. For more information, and to see program requirements, click on the More Info button to the right.

## 2024-07-14 NOTE — Telephone Encounter (Signed)
 Prior Authorization for patient (Wegovy  2.4MG /0.75ML auto-injectors) came through on cover my meds was submitted.

## 2024-08-26 ENCOUNTER — Encounter (HOSPITAL_COMMUNITY): Payer: Self-pay | Admitting: *Deleted

## 2024-08-26 ENCOUNTER — Ambulatory Visit (HOSPITAL_COMMUNITY)
Admission: EM | Admit: 2024-08-26 | Discharge: 2024-08-26 | Disposition: A | Discharge status: Home / Self Care | Attending: Emergency Medicine | Admitting: Emergency Medicine

## 2024-08-26 DIAGNOSIS — N3001 Acute cystitis with hematuria: Secondary | ICD-10-CM | POA: Insufficient documentation

## 2024-08-26 DIAGNOSIS — R300 Dysuria: Secondary | ICD-10-CM | POA: Diagnosis not present

## 2024-08-26 LAB — POCT URINE DIPSTICK
Bilirubin, UA: NEGATIVE
Glucose, UA: NEGATIVE mg/dL
Ketones, POC UA: NEGATIVE mg/dL
Nitrite, UA: POSITIVE — AB
POC PROTEIN,UA: 100 — AB
Spec Grav, UA: 1.025 (ref 1.010–1.025)
Urobilinogen, UA: 0.2 U/dL
pH, UA: 5.5 (ref 5.0–8.0)

## 2024-08-26 LAB — POCT URINE PREGNANCY: Preg Test, Ur: NEGATIVE

## 2024-08-26 MED ORDER — CEPHALEXIN 500 MG PO CAPS: 500.0000 mg | ORAL_CAPSULE | Freq: Two times a day (BID) | ORAL | 0 refills | Status: AC

## 2024-08-26 NOTE — ED Triage Notes (Signed)
 Professional interpreter service used for clinical intake.   Pt states that she has been having dysuria, increased urine frequency and blood in her urine X 8 days. She has been taking AZO

## 2024-08-26 NOTE — Discharge Instructions (Addendum)
 Comience a tomar radiation protection practitioner al da durante 7 das para la infeccin urinaria. Hemos enviado una muestra para cultivo de orina y, si los [place] requieren algn ajuste en el tratamiento, nos comunicaremos con usted. Si presenta dolor en el costado, fiebre, aumento de la cantidad de sangre en la orina, debilidad o [place], busque atencin mdica de inmediato en el servicio de [place].  Start taking cephalexin twice daily for 7 days for urinary tract infection. We have sent a urine culture and if these results require any adjustment in treatment someone will call to let you know. If you develop flank pain, fever, increased amounts of blood in your urine, weakness, or passing out please seek immediate medical treatment in the emergency department.
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
## 2024-08-28 ENCOUNTER — Other Ambulatory Visit: Payer: Self-pay

## 2024-08-28 MED ORDER — WEGOVY 2.4 MG/0.75ML ~~LOC~~ SOAJ: 2.4000 mg | SUBCUTANEOUS | 1 refills | Status: DC

## 2024-08-28 NOTE — Telephone Encounter (Signed)
 Copied from CRM #[id number]. Topic: Clinical - Medication Refill >> Aug 28, 2024  3:17 PM DeAngela L wrote: Medication: semaglutide -weight management (WEGOVY ) 2.4 MG/0.75ML SOAJ SQ injection   Has the patient contacted their pharmacy? Yes  (Agent: If no, request that the patient contact the pharmacy for the refill. If patient does not wish to contact the pharmacy document the reason why and proceed with request.) (Agent: If yes, when and what did the pharmacy advise?)  This is the patient's preferred pharmacy:  [id number] [place], [place] - [address] [place] [address] Phone: [phone] Fax: [phone]  Is this the correct pharmacy for this prescription? Yes  If no, delete pharmacy and type the correct one.   Has the prescription been filled recently? Yes   Is the patient out of the medication? Yes   Has the patient been seen for an appointment in the last year OR does the patient have an upcoming appointment? Yes   Can we respond through MyChart? Yes   Agent: Please be advised that Rx refills may take up to 3 business days. We ask that you follow-up with your pharmacy.

## 2024-08-29 ENCOUNTER — Ambulatory Visit (HOSPITAL_COMMUNITY): Payer: Self-pay

## 2024-08-29 LAB — URINE CULTURE: Culture: >=100000 — AB

## 2024-09-02 ENCOUNTER — Ambulatory Visit (INDEPENDENT_AMBULATORY_CARE_PROVIDER_SITE_OTHER): Payer: Self-pay

## 2024-09-02 ENCOUNTER — Other Ambulatory Visit (HOSPITAL_COMMUNITY)
Admission: RE | Admit: 2024-09-02 | Discharge: 2024-09-02 | Disposition: A | Discharge status: Home / Self Care | Source: Ambulatory Visit | Attending: Internal Medicine | Admitting: Internal Medicine

## 2024-09-02 VITALS — BP 100/63 | HR 74 | Temp 97.8°F | Ht 62.0 in | Wt 144.4 lb

## 2024-09-02 DIAGNOSIS — B3731 Acute candidiasis of vulva and vagina: Secondary | ICD-10-CM | POA: Insufficient documentation

## 2024-09-02 DIAGNOSIS — N939 Abnormal uterine and vaginal bleeding, unspecified: Secondary | ICD-10-CM

## 2024-09-02 MED ORDER — FLUCONAZOLE 150 MG PO TABS: 150.0000 mg | ORAL_TABLET | Freq: Every day | ORAL | 0 refills | Status: AC

## 2024-09-02 NOTE — Patient Instructions (Addendum)
 espaol: Acudi a consulta por sntomas urinarios y picazn en la zona plvica. Siga las instrucciones del plan de hoy:  Comience a tomar lo siguiente para una posible candidiasis vaginal:  Fluconazol 150 mg: tome una pastilla hoy. Si los sntomas no mejoran en los prximos 3 das, tome otra pastilla.  -Le hemos tomado Sheri Perez para analizarla y engineer, manufacturing una posible candidiasis vaginal. Le llamaremos cuando tengamos los [name].  -Hemos solicitado una ecografa transvaginal. Pronto nos pondremos en contacto con usted para indicarle cundo debe realizarse.  Gracias.  Sheri Perez   Ingls: You were seen for urinary symptoms and itching in the pelvic area. Please follow the instructions as discussed in today's plan:  Start taking the follow for a potential yeast infection: Fluconazole 150 mg: take 1 pill today. If symptoms do not resolve in the next 3 days, then take another pill.   -We have collected a sample from you to test for a yeast infection. Will call you when the results come back. -We have ordered a transvaginal ultrasound. Someone should be reaching out to you shortly regarding when to get it done.   Thank You  Sincerely, [name]
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
## 2024-09-03 ENCOUNTER — Ambulatory Visit: Payer: Self-pay

## 2024-09-03 LAB — CERVICOVAGINAL ANCILLARY ONLY
Bacterial Vaginitis (gardnerella): NEGATIVE
Candida Glabrata: NEGATIVE
Candida Vaginitis: POSITIVE — AB
Comment: NEGATIVE
Comment: NEGATIVE
Comment: NEGATIVE

## 2024-09-03 NOTE — Telephone Encounter (Signed)
 Informed pt that she was positive for a yeast infection. Treatment for that would be the same as what was discussed during her visit yesterday. Pt stated understanding.

## 2024-09-05 ENCOUNTER — Other Ambulatory Visit (HOSPITAL_COMMUNITY): Payer: Self-pay

## 2024-09-05 ENCOUNTER — Telehealth: Payer: Self-pay | Admitting: *Deleted

## 2024-09-05 NOTE — Telephone Encounter (Signed)
 Will forward to DOROTHA Staff and C. Everett.                                             Copied from CRM #[id number]. Topic: Clinical - Prescription Issue >> Sep 05, 2024 11:56 AM DeAngela L wrote: Reason for CRM: the patient is calling cause she went to the pharmacy to pick up her medication  semaglutide -weight management (WEGOVY ) 2.4 MG/0.75ML SOAJ SQ injection The patient is not sure why the approval is taking so long with the insurance company cause usually it s 3 days and not 3 weeks  Patient is asking can the doctor office do something so the insurance company can approve faster ?  Pt num [phone] (M)

## 2024-09-08 NOTE — Telephone Encounter (Signed)
 Patient is aware that medicaid no longer covers GLP-1. Patient is scheduled for a virtual visit tomorrow morning with Dr.Ingold.

## 2024-09-09 ENCOUNTER — Telehealth

## 2024-09-09 DIAGNOSIS — Z760 Encounter for issue of repeat prescription: Secondary | ICD-10-CM | POA: Diagnosis present

## 2024-09-09 DIAGNOSIS — Z7985 Long-term (current) use of injectable non-insulin antidiabetic drugs: Secondary | ICD-10-CM | POA: Diagnosis not present

## 2024-09-09 DIAGNOSIS — R635 Abnormal weight gain: Secondary | ICD-10-CM
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
## 2024-09-10 NOTE — Progress Notes (Signed)
 Internal Medicine Clinic Attending  I was physically present during the key portions of the resident provided service, performed the bimanual exam, and participated in the medical decision making of patient's management care. I reviewed pertinent patient test results.  The assessment, diagnosis, and plan were formulated together and I agree with the documentation in the resident's note. Ms. Ramer does have localized tenderness over the L adnexa though no palpable mass was appreciated. She is not systemically ill. Negative chandelier sign.   Trudy Mliss Dragon, MD

## 2024-09-14 MED ORDER — WEGOVY 2.4 MG/0.75ML ~~LOC~~ SOAJ: 2.4000 mg | SUBCUTANEOUS | 1 refills | Status: AC

## 2024-09-14 NOTE — Progress Notes (Addendum)
" ° °  Virtual Visit via Video Note  I connected with Sheri Perez on 09/09/24 at 10:15 AM EST by a video enabled telemedicine application and verified that I am speaking with the correct person using two identifiers.  Patient Location: Home Provider Location: Office/Clinic  I discussed the limitations, risks, security, and privacy concerns of performing an evaluation and management service by video and the availability of in person appointments. I also discussed with the patient that there may be a patient responsible charge related to this service. The patient expressed understanding and agreed to proceed.  Subjective: PCP: Amilibia, Jaden, DO  No chief complaint on file.  HPI Patient presents today for telehealth visit to discuss side effects of Wegovy .  Please see assessment/plan for additional details.  ROS: Per HPI, assessment, plan  Current Outpatient Medications:    acetaminophen  (TYLENOL ) 500 MG tablet, Take 2 tablets (1,000 mg total) by mouth every 8 (eight) hours as needed (pain). (Patient not taking: Reported on 09/21/2021), Disp: 60 tablet, Rfl: 0   fluconazole  (DIFLUCAN ) 150 MG tablet, Take 1 tablet (150 mg total) by mouth daily., Disp: 2 tablet, Rfl: 0   gabapentin  (NEURONTIN ) 300 MG capsule, Take 1 capsule (300 mg total) by mouth at bedtime., Disp: 30 capsule, Rfl: 0   ibuprofen  (ADVIL ) 600 MG tablet, Take 1 tablet (600 mg total) by mouth every 6 (six) hours as needed (pain). (Patient not taking: Reported on 09/21/2021), Disp: 40 tablet, Rfl: 0   Prenatal Vit-Fe Fumarate-FA (PRENATAL VITAMIN) 27-0.8 MG TABS, Take 1 tablet by mouth daily. (Patient taking differently: Take 1 tablet by mouth every evening.), Disp: 90 tablet, Rfl: 1   semaglutide -weight management (WEGOVY ) 2.4 MG/0.75ML SOAJ SQ injection, Inject 2.4 mg into the skin once a week., Disp: 3 mL, Rfl: 1  Observations/Objective: There were no vitals filed for this visit. Physical Exam N/A Assessment and  Plan: Assessment & Plan Patient reports stopping Wegovy  3 weeks ago as she could not get a refill due to insurance coverage.  Requested another refill to see if insurance is approved.  Discussed with patient we can re-prescribe Wegovy  but insurance may not approve it.  Patient stated understanding.  Patient did not have any side effects throughout using Wegovy .  Explained to her that stopping it suddenly should not lead to new symptoms.  Discussed patient's BMI has improved from the beginning of the year and that it is still slightly higher than 24.9 which is the upper limit of normal BMI.  Offered services with RD Ms. Arland.  Patient reports seeing Arland in the past. Patient explains she is comfortable taking care of her weight with diet and exercise.   -Prescription for Wegovy    Follow Up Instructions: No follow-ups on file.     The patient was advised to call back or seek an in-person evaluation if the symptoms worsen or if the condition fails to improve as anticipated.    Patient discussed with Dr. Jeanelle Rebecka Pion, DO     "

## 2024-09-17 ENCOUNTER — Other Ambulatory Visit

## 2024-09-17 ENCOUNTER — Telehealth: Payer: Self-pay | Admitting: *Deleted

## 2024-09-17 NOTE — Telephone Encounter (Signed)
 Will forward to Jada, CMA,   Lavern Ancona and PCP.                            Copied from CRM [id number]. Topic: Clinical - Medication Question >> Sep 17, 2024  3:21 PM Diannia H wrote: Reason for CRM: Patient had her video visit and she understood that her insurance might not cover it but she wanted to chance it but the insurance didn't. This is the message previously. She is stating she is going to try to pay out of pocket but is there any coupons at [facility] or another pharmacy so it wont be so much. Could you assist? Pt num [phone] (M). She is also wanting to know if there is a similar medicine that her insurance will cover it. She didn't think she needed the meds at first but now she does. The longer she goes without it she can tell she does need it.  the patient is calling cause she went to the pharmacy to pick up her medication  semaglutide -weight management (WEGOVY ) 2.4 MG/0.75ML SOAJ SQ injection The patient is not sure why the approval is taking so long with the insurance company cause usually it s 3 days and not 3 weeks  Patient is asking can the doctor office do something so the insurance company can approve faster ? >> Sep 17, 2024  4:08 PM Chiquita SQUIBB wrote: Saddie with [facility] is calling in stating that they have not received any prior authorization from the office and the patient was told it was sent over weeks ago but they have not received anything regarding this patient for the [facility] medication. The fax number for any prior auth. is [phone] and a good call back number is [phone]

## 2024-09-18 NOTE — Telephone Encounter (Signed)
 I called and spoke to the patient with a interpreter on the line. I advised the patient again that her insurance does not cover any weight loss medications. I offered the patient a option to be referred to healthy weight and wellness, patient stated she would like a referral to be placed.

## 2024-09-25 NOTE — Progress Notes (Signed)
 Internal Medicine Clinic Attending  Case discussed with the resident at the time of the visit.  We reviewed the resident's history and exam and pertinent patient test results.  I agree with the assessment, diagnosis, and plan of care documented in the resident's note.

## 2024-09-26 ENCOUNTER — Other Ambulatory Visit

## 2024-10-31 ENCOUNTER — Other Ambulatory Visit: Payer: Self-pay | Admitting: Student

## 2024-10-31 DIAGNOSIS — R635 Abnormal weight gain: Secondary | ICD-10-CM

## 2024-11-03 ENCOUNTER — Encounter (INDEPENDENT_AMBULATORY_CARE_PROVIDER_SITE_OTHER): Payer: Self-pay
